# Patient Record
Sex: Female | Born: 1935 | Race: White | Hispanic: No | Smoking: Current every day smoker
Health system: Southern US, Community
[De-identification: ages and names within clinical notes are randomized; demographics above are authoritative.]

## PROBLEM LIST (undated history)

## (undated) DIAGNOSIS — E7800 Pure hypercholesterolemia, unspecified: Secondary | ICD-10-CM

## (undated) DIAGNOSIS — J449 Chronic obstructive pulmonary disease, unspecified: Secondary | ICD-10-CM

## (undated) DIAGNOSIS — I639 Cerebral infarction, unspecified: Secondary | ICD-10-CM

## (undated) DIAGNOSIS — E119 Type 2 diabetes mellitus without complications: Secondary | ICD-10-CM

## (undated) DIAGNOSIS — I10 Essential (primary) hypertension: Secondary | ICD-10-CM

## (undated) DIAGNOSIS — E785 Hyperlipidemia, unspecified: Secondary | ICD-10-CM

## (undated) DIAGNOSIS — J189 Pneumonia, unspecified organism: Secondary | ICD-10-CM

## (undated) DIAGNOSIS — F419 Anxiety disorder, unspecified: Secondary | ICD-10-CM

## (undated) HISTORY — PX: ABDOMINAL HYSTERECTOMY: SHX81

## (undated) HISTORY — PX: EYE SURGERY: SHX253

## (undated) HISTORY — PX: KNEE SURGERY: SHX244

## (undated) HISTORY — DX: Hyperlipidemia, unspecified: E78.5

## (undated) HISTORY — PX: COLONOSCOPY: SHX174

## (undated) HISTORY — PX: TONSILLECTOMY: SUR1361

## (undated) HISTORY — PX: OTHER SURGICAL HISTORY: SHX169

## (undated) HISTORY — PX: LUNG SURGERY: SHX703

---

## 1997-08-05 ENCOUNTER — Inpatient Hospital Stay (HOSPITAL_COMMUNITY): Admission: RE | Admit: 1997-08-05 | Discharge: 1997-08-17 | Payer: Self-pay | Admitting: Thoracic Surgery

## 1997-09-14 ENCOUNTER — Emergency Department (HOSPITAL_COMMUNITY): Admission: EM | Admit: 1997-09-14 | Discharge: 1997-09-14 | Payer: Self-pay | Admitting: Emergency Medicine

## 2000-06-05 ENCOUNTER — Encounter: Payer: Self-pay | Admitting: Family Medicine

## 2000-06-05 ENCOUNTER — Encounter: Admission: RE | Admit: 2000-06-05 | Discharge: 2000-06-05 | Payer: Self-pay | Admitting: Family Medicine

## 2003-02-03 ENCOUNTER — Emergency Department (HOSPITAL_COMMUNITY): Admission: EM | Admit: 2003-02-03 | Discharge: 2003-02-03 | Payer: Self-pay | Admitting: Emergency Medicine

## 2003-06-23 ENCOUNTER — Ambulatory Visit (HOSPITAL_COMMUNITY): Admission: RE | Admit: 2003-06-23 | Discharge: 2003-06-23 | Payer: Self-pay | Admitting: Family Medicine

## 2005-04-22 ENCOUNTER — Emergency Department (HOSPITAL_COMMUNITY): Admission: EM | Admit: 2005-04-22 | Discharge: 2005-04-22 | Payer: Self-pay | Admitting: Emergency Medicine

## 2005-10-01 ENCOUNTER — Encounter: Admission: RE | Admit: 2005-10-01 | Discharge: 2005-10-01 | Payer: Self-pay | Admitting: Orthopedic Surgery

## 2005-12-15 ENCOUNTER — Emergency Department (HOSPITAL_COMMUNITY): Admission: EM | Admit: 2005-12-15 | Discharge: 2005-12-15 | Payer: Self-pay | Admitting: Emergency Medicine

## 2007-12-04 ENCOUNTER — Ambulatory Visit (HOSPITAL_COMMUNITY): Admission: RE | Admit: 2007-12-04 | Discharge: 2007-12-04 | Payer: Self-pay | Admitting: Ophthalmology

## 2008-02-14 ENCOUNTER — Encounter: Payer: Self-pay | Admitting: Emergency Medicine

## 2008-02-15 ENCOUNTER — Inpatient Hospital Stay (HOSPITAL_COMMUNITY): Admission: EM | Admit: 2008-02-15 | Discharge: 2008-02-16 | Payer: Self-pay | Admitting: *Deleted

## 2008-02-16 ENCOUNTER — Ambulatory Visit: Payer: Self-pay | Admitting: *Deleted

## 2009-06-15 ENCOUNTER — Emergency Department (HOSPITAL_COMMUNITY): Admission: EM | Admit: 2009-06-15 | Discharge: 2009-06-15 | Payer: Self-pay | Admitting: Emergency Medicine

## 2010-01-04 ENCOUNTER — Emergency Department (HOSPITAL_COMMUNITY): Admission: EM | Admit: 2010-01-04 | Discharge: 2010-01-04 | Payer: Self-pay | Admitting: Emergency Medicine

## 2010-06-22 LAB — BASIC METABOLIC PANEL
Chloride: 108 mEq/L (ref 96–112)
GFR calc Af Amer: 41 mL/min — ABNORMAL LOW (ref >60)
Potassium: 3.9 mEq/L (ref 3.5–5.1)

## 2010-06-22 LAB — CBC
HCT: 40.6 % (ref 36.0–46.0)
Hemoglobin: 13.6 g/dL (ref 12.0–15.0)
MCH: 31.9 pg (ref 26.0–34.0)
MCHC: 33.5 g/dL (ref 30.0–36.0)
RBC: 4.27 MIL/uL (ref 3.87–5.11)
WBC: 9.9 10*3/uL (ref 4.0–10.5)

## 2010-06-22 LAB — DIFFERENTIAL
Basophils Absolute: 0 10*3/uL (ref 0.0–0.1)
Basophils Relative: 0 % (ref 0–1)
Eosinophils Absolute: 0 10*3/uL (ref 0.0–0.7)
Eosinophils Relative: 0 % (ref 0–5)
Neutro Abs: 7 10*3/uL (ref 1.7–7.7)

## 2010-06-22 LAB — POCT CARDIAC MARKERS
Myoglobin, poc: 60.9 ng/mL (ref 12–200)
Troponin i, poc: <0.05 ng/mL (ref 0.00–0.09)
Troponin i, poc: <0.05 ng/mL (ref 0.00–0.09)

## 2010-08-22 NOTE — Discharge Summary (Signed)
NAME:  Terri Chen, Terri Chen NO.:  [id number]   MEDICAL RECORD NO.:  [id number]          PATIENT TYPE:  IPS   LOCATION:  0301                          FACILITY:  [facility]   PHYSICIAN:  Jasmine Pang, M.D. DATE OF BIRTH:  Dec 10, 1934   DATE OF ADMISSION:  02/15/2008  DATE OF DISCHARGE:  02/16/2008                               DISCHARGE SUMMARY   IDENTIFICATION:  This is a 76 year old widowed white female who was  admitted on a voluntary basis on February 14, 2008.   HISTORY OF PRESENT ILLNESS:  Apparently, she had an argument with her  daughter on the day of admission and took an unknown amount of 0.5 mg  Xanax.  Apparently, it was not that many and she never lost  consciousness.  She stated she wanted to go to sleep and not wake up.  She is grieving the recent death of a nephew who suicided.  This nephew  had brain cancer and it is unclear if he knew what he was doing or not  at that time he suicided by overdose.  She has never had any outpatient  or inpatient psychiatric care.  Her 42 year old daughter and daughter's  two sons were 40 and 68 live with her.  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   PAST PSYCHIATRIC HISTORY:  The patient does not have any family history.  The patient had a nephew with cancer of the brain as indicated above,  who suicided about a week ago.   ALCOHOL AND DRUG HISTORY:  The patient does smoke 1 pack of cigarettes  per day.  She denies use of drugs or alcohol.   MEDICAL PROBLEMS:  Hypertension, diabetes, status post stroke in 08-20-1977,  which precipitated her retirement.   MEDICATION:  She is currently prescribed,  1. Amlodipine 5/10 mg p.o. daily.  2. Metformin 500 mg p.o. b.i.d.  3. Metoprolol tartrate 100 mg p.o. daily.  4. Aspirin 81 mg p.o. daily.   ALLERGIES:  CODEINE.   PHYSICAL FINDINGS:  There were no acute physical or medical problems.  She was medically cleared in the emergency department at  [facility].   HOSPITAL COURSE:  Upon admission, the patient was continued on her home  medication of amlodipine/benazepril 5/10 mg daily, metoprolol 100 mg  daily, aspirin 81 mg daily, and metformin 500 mg p.o. b.i.d.  She stated  she did not want to be on medication.  She was just interested in  therapy.  She felt she could do well without any psychiatric  medications.  In individual sessions with me, the patient was friendly  and cooperative.  She also participated appropriately in unit  therapeutic groups and activities.  She stated she plans to live with  her daughter when she goes home.  She discussed the death of her husband  in 21-Aug-2007.  Her mother also died recently, which has been a stress for  her.  She has no alcohol or drug use.  She discussed the 12 year old  daughter living with her, who causes a  lot of problems because of her  substance use.  She again reiterated she did not want any  antidepressant.  She was willing to talk with a therapist, however.  On  February 16, 2008, mental status had improved markedly from admission  status.  She was still having some difficulty sleeping, but appetite was  good.  Mood was less depressed, less anxious.  Affect was consistent  with mood.  There was no suicidal or homicidal ideation.  No thoughts of  self-injurious behavior.  No auditory or visual hallucinations.  No  paranoia or delusions.  Thoughts were logical and goal-directed, thought  content.  No predominant theme.  Cognitive was grossly intact.  Insight  good, judgment good, impulse control good.  The patient wanted to go  home today and she was felt to be safe for discharge.  She will go to  [facility] in [place] for her followup appointment.  Again, she stated she does not want any mental health medications, but  is open to counseling.   DISCHARGE DIAGNOSES:  Axis I:  Depressive disorder, not otherwise  specified.  Axis II:  None.  Axis III:   Hypertension, diabetes, status post stroke.  Axis IV:  Severe (nephew's death and conflict with her biological  daughter, also mother's death, burden of psychiatric illness, burden of  medical problems).  Axis V:  Global assessment of functioning was 50 upon discharge.  GAF  was 25 upon admission.  GAF highest past year was 65.   DISCHARGE PLAN:  There were no specific activity level or dietary  restrictions except for that necessitated by her diabetes.   POSTHOSPITAL CARE PLANS:  The patient will go to [facility] at [place] on  February 19, 2008, at 8 o'clock a.m.   DISCHARGE MEDICATIONS:  1. Glucophage 500 mg twice daily.  2. Lopressor 100 mg daily.  3. Aspirin 81 mg daily.  4. Lotrel 5/10 mg daily.   She was not started on any mental health medication at her request, but  may be open to this in the future if she begins to feel worse again.      Jasmine Pang, M.D.  Electronically Signed     BHS/MEDQ  D:  02/16/2008  T:  02/17/2008  Job:  [id number]

## 2010-08-22 NOTE — H&P (Signed)
NAME:  Terri Chen, Terri Chen NO.:  [id number]   MEDICAL RECORD NO.:  [id number]          PATIENT TYPE:  IPS   LOCATION:  0301                          FACILITY:  [facility]   PHYSICIAN:  Jasmine Pang, M.D. DATE OF BIRTH:  05/18/1934   DATE OF ADMISSION:  02/15/2008  DATE OF DISCHARGE:                       PSYCHIATRIC ADMISSION ASSESSMENT   This is a voluntary admission to the services of Dr. Milford Cage.  This is a 76 year old widowed white female.  Apparently she had an  argument with her daughter yesterday and took an unknown amount of 0.5  Xanax.  Apparently it was not that many as she never lost consciousness.  She stated she wanted to go to sleep and not wake up.  She is grieving  the recent death of a nephew who suicided.  This nephew had brain cancer  and it is unclear if he really knew what he was doing or not at the time  he suicided.  She has never had any inpatient or outpatient psychiatric  care.  Her 66 year old daughter and her daughter's two sons who are 3  and 48 live with her.  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   PAST PSYCHIATRIC HISTORY:  She does not have any.   SOCIAL HISTORY:  Her husband died in 09/07/1999.  Her daughter and her two  teenage sons lives with the patient.  The daughter is quite unreliable,  apparently abused drugs and goes off and abandons her children from time  to time.   FAMILY HISTORY:  A nephew who had cancer of the brain suicided about a  week ago.   ALCOHOL AND DRUG HISTORY:  The patient does smoke 1 pack of cigarettes  per day.   PRIMARY CARE PHYSICIAN:  Her primary care Demonie Kassa is Dr. Doristine Counter in  [place].   MEDICAL PROBLEMS:  Hypertension, diabetes.  She is status post a stroke  in 09-06-77 which precipitated her retirement.   MEDICATIONS:  1. She is currently prescribed amlodipine 5/10 mg p.o. daily.  2. Metformin 500 mg p.o. b.i.d.  3. Metoprolol tartrate 100 mg p.o. daily.  4.  Aspirin 81 mg p.o. daily.   DRUG ALLERGIES:  CODEINE.   POSITIVE PHYSICAL FINDINGS:  Physically she looks younger than her  stated age of 49 although her face has a lot of wrinkles.  Her motor is  of a younger person as well.  She was medically cleared in the emergency  department at [facility].  There were no remarkable findings.  Her vital  signs on admission show she is 54 inches tall, weighs 146, temperature  is 97.6, blood pressure is 142/82, pulse 59, respirations 18.  She is  status post a stroke in 09/06/77 affecting her left side.  She had cataracts  repaired in 09/06/2001 and August of 2006.   MENTAL STATUS EXAM:  She is alert and oriented.  She was casually  dressed and groomed in pajamas.  She appeared to be adequately  nourished.  Her speech is a normal rate, rhythm and tone.  Her  mood was  appropriate to the situation.  Her affect had a normal range.  Her  thought processes were clear, rational and goal oriented.  Judgment and  insight are intact.  Concentration and memory are intact.  Intelligence  is at least average.  She denies suicidal or homicidal.  She denies  auditory or visual hallucinations.  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  It is  probably actually more adjustment disorder with mixed response of  emotions and conduct.  AXIS II:  None.  AXIS III:  Hypertension, diabetes, status post stroke.  AXIS IV:  Nephew's death and conflict with her biological daughter.  AXIS V:  25.   PLAN:  To identify a therapist for her as she does not want medications.  Toward that end we will have the case manager talk with her tomorrow and  also to evaluate to make sure there is no elder abuse going on.  She  does have a plan that she has two adopted children that live in [place]  and apparently she is welcome at any time and the patient herself has a  discharge plan to go visit with these children.      Mickie Leonarda Salon,  P.A.-C.      Jasmine Pang, M.D.  Electronically Signed    MD/MEDQ  D:  02/15/2008  T:  02/15/2008  Job:  [id number]

## 2011-01-09 LAB — DIFFERENTIAL
Basophils Relative: 1
Eosinophils Relative: 2
Lymphs Abs: 4.7 — ABNORMAL HIGH
Monocytes Relative: 7
Neutro Abs: 3.6
Neutrophils Relative %: 40 — ABNORMAL LOW

## 2011-01-09 LAB — URINALYSIS, ROUTINE W REFLEX MICROSCOPIC
Glucose, UA: NEGATIVE
Ketones, ur: NEGATIVE
Protein, ur: NEGATIVE
Specific Gravity, Urine: 1.02
Urobilinogen, UA: 0.2
pH: 6

## 2011-01-09 LAB — POCT CARDIAC MARKERS
CKMB, poc: <1 — ABNORMAL LOW
Troponin i, poc: <0.05

## 2011-01-09 LAB — RAPID URINE DRUG SCREEN, HOSP PERFORMED
Amphetamines: NOT DETECTED
Cocaine: NOT DETECTED
Opiates: NOT DETECTED
Tetrahydrocannabinol: NOT DETECTED

## 2011-01-09 LAB — SALICYLATE LEVEL: Salicylate Lvl: <4

## 2011-01-09 LAB — CBC
Hemoglobin: 13.5
MCHC: 34.7
Platelets: 225
RDW: 13.2

## 2011-01-09 LAB — COMPREHENSIVE METABOLIC PANEL
ALT: 12
GFR calc Af Amer: >60
GFR calc non Af Amer: >60
Glucose, Bld: 113 — ABNORMAL HIGH
Potassium: 3.4 — ABNORMAL LOW
Sodium: 137
Total Bilirubin: 0.5
Total Protein: 5.7 — ABNORMAL LOW

## 2011-01-09 LAB — GLUCOSE, CAPILLARY
Glucose-Capillary: 98
Glucose-Capillary: 110 — ABNORMAL HIGH
Glucose-Capillary: 113 — ABNORMAL HIGH
Glucose-Capillary: 143 — ABNORMAL HIGH

## 2011-01-09 LAB — ETHANOL: Alcohol, Ethyl (B): <5

## 2011-01-09 LAB — APTT: aPTT: 24

## 2011-01-09 LAB — PROTIME-INR: INR: 0.9

## 2011-01-09 LAB — ACETAMINOPHEN LEVEL: Acetaminophen (Tylenol), Serum: <10 — ABNORMAL LOW

## 2011-08-26 ENCOUNTER — Encounter (HOSPITAL_BASED_OUTPATIENT_CLINIC_OR_DEPARTMENT_OTHER): Payer: Self-pay | Admitting: *Deleted

## 2011-08-26 ENCOUNTER — Emergency Department (HOSPITAL_BASED_OUTPATIENT_CLINIC_OR_DEPARTMENT_OTHER)
Admission: EM | Admit: 2011-08-26 | Discharge: 2011-08-26 | Disposition: A | Discharge status: Home / Self Care | Payer: Medicare Other | Attending: Emergency Medicine | Admitting: Emergency Medicine

## 2011-08-26 DIAGNOSIS — Z8673 Personal history of transient ischemic attack (TIA), and cerebral infarction without residual deficits: Secondary | ICD-10-CM | POA: Insufficient documentation

## 2011-08-26 DIAGNOSIS — I10 Essential (primary) hypertension: Secondary | ICD-10-CM | POA: Insufficient documentation

## 2011-08-26 DIAGNOSIS — Z79899 Other long term (current) drug therapy: Secondary | ICD-10-CM | POA: Insufficient documentation

## 2011-08-26 DIAGNOSIS — E119 Type 2 diabetes mellitus without complications: Secondary | ICD-10-CM | POA: Insufficient documentation

## 2011-08-26 DIAGNOSIS — IMO0002 Reserved for concepts with insufficient information to code with codable children: Secondary | ICD-10-CM

## 2011-08-26 DIAGNOSIS — L089 Local infection of the skin and subcutaneous tissue, unspecified: Secondary | ICD-10-CM | POA: Insufficient documentation

## 2011-08-26 DIAGNOSIS — Y92009 Unspecified place in unspecified non-institutional (private) residence as the place of occurrence of the external cause: Secondary | ICD-10-CM | POA: Insufficient documentation

## 2011-08-26 HISTORY — DX: Cerebral infarction, unspecified: I63.9

## 2011-08-26 HISTORY — DX: Essential (primary) hypertension: I10

## 2011-08-26 MED ORDER — CLINDAMYCIN HCL 150 MG PO CAPS: 300.0000 mg | ORAL_CAPSULE | Freq: Four times a day (QID) | ORAL | Status: AC

## 2011-08-26 MED ORDER — TRAMADOL HCL 50 MG PO TABS: 50.0000 mg | ORAL_TABLET | Freq: Four times a day (QID) | ORAL | Status: AC | PRN

## 2011-08-26 NOTE — ED Notes (Signed)
rx x 2 for clindamycin and tramadol given

## 2011-08-26 NOTE — ED Notes (Signed)
Pt states she ?was bitten by a spider in 2 areas of her head Friday. Presents with 2 slightly reddened areas to top of head and neck. States she has been bitten before and this feels the same.

## 2011-08-26 NOTE — ED Provider Notes (Signed)
History  This chart was scribed for Loren Racer, MD by Cherlynn Perches. The patient was seen in room MHT13/MHT13. Patient's care was started at 1443.   CSN: [id number]  Arrival date & time 08/26/11  1443   First MD Initiated Contact with Patient 08/26/11 1536      Chief Complaint  Patient presents with  . Insect Bite    (Consider location/radiation/quality/duration/timing/severity/associated sxs/prior treatment) HPI  Terri Chen is a 77 y.o. female who presents to the Emergency Department complaining of 2 days of sudden onset, unchanged insect bites localized to the top-left side and bottom-left side of head with associated nausea. Pt reports that she believes she was bitten by a spider in her house. Pt states that she has been bitten by a spider before and had to be admitted to the hospital for an infection. She reports that symptoms are similar to this previous incident. Pt denies abdominal pain, vomiting, and fever.  Past Medical History  Diagnosis Date  . Diabetes mellitus   . Hypertension   . Stroke     Past Surgical History  Procedure Date  . Lung surgery   . Knee surgery   . Abdominal hysterectomy     History reviewed. No pertinent family history.  History  Substance Use Topics  . Smoking status: Current Some Day Smoker  . Smokeless tobacco: Not on file  . Alcohol Use: No    OB History    Grav Para Term Preterm Abortions TAB SAB Ect Mult Living                  Review of Systems  Constitutional: Positive for chills. Negative for fever.  HENT: Negative for congestion and neck pain.   Respiratory: Negative for cough and shortness of breath.   Cardiovascular: Negative for chest pain.  Gastrointestinal: Positive for nausea. Negative for vomiting and abdominal pain.  Musculoskeletal: Negative for back pain.  Skin: Positive for color change (redness to head).       Insect bite  All other systems reviewed and are negative.    Allergies  Review of  patient's allergies indicates no known allergies.  Home Medications   Current Outpatient Rx  Name Route Sig Dispense Refill  . AMLODIPINE BESYLATE 5 MG PO TABS Oral Take 5 mg by mouth daily.    Marland Kitchen BENAZEPRIL HCL 20 MG PO TABS Oral Take 20 mg by mouth daily.    Marland Kitchen HYDROCHLOROTHIAZIDE 25 MG PO TABS Oral Take 25 mg by mouth daily.    Marland Kitchen METFORMIN HCL 1000 MG PO TABS Oral Take 1,000 mg by mouth 2 (two) times daily.    Marland Kitchen PRAVASTATIN SODIUM 40 MG PO TABS Oral Take 40 mg by mouth daily.    Marland Kitchen CLINDAMYCIN HCL 150 MG PO CAPS Oral Take 2 capsules (300 mg total) by mouth every 6 (six) hours. 40 capsule 0  . TRAMADOL HCL 50 MG PO TABS Oral Take 1 tablet (50 mg total) by mouth every 6 (six) hours as needed for pain. 15 tablet 0    BP 127/45  Pulse 65  Temp(Src) 97.9 F (36.6 C) (Oral)  Resp 20  Ht 5\' 4"  (1.626 m)  Wt 155 lb (70.308 kg)  BMI 26.61 kg/m2  SpO2 99%  Physical Exam  Nursing note and vitals reviewed. Constitutional: She is oriented to person, place, and time. She appears well-developed and well-nourished.  HENT:  Head: Normocephalic and atraumatic.       1 area of erythema on left  occipital, 1 area on left parietal, mild cellulitis, no fluctuance, evidence of abscess.  Eyes: Conjunctivae are normal. No scleral icterus.  Neck: Normal range of motion. Neck supple.  Cardiovascular: Normal rate and regular rhythm.  Exam reveals no gallop and no friction rub.   No murmur heard. Pulmonary/Chest: Effort normal and breath sounds normal. No respiratory distress.  Abdominal: Soft. There is no tenderness.  Musculoskeletal: Normal range of motion. She exhibits no edema.  Neurological: She is alert and oriented to person, place, and time.       Grip strength normal  Skin: Skin is warm and dry.  Psychiatric: She has a normal mood and affect. Her behavior is normal.    ED Course  Procedures (including critical care time)  DIAGNOSTIC STUDIES: Oxygen Saturation is 99% on room air, normal by  my interpretation.    COORDINATION OF CARE: 3:45PM - will prescribe antibiotics to prevent infection. Pt agrees with plan.    Labs Reviewed - No data to display No results found.   1. Bite by nonvenomous insect of face/neck/scalp, with infection       MDM        I personally performed the services described in this documentation, which was scribed in my presence. The recorded information has been reviewed and considered.     Loren Racer, MD 08/26/11 2039

## 2011-08-26 NOTE — Discharge Instructions (Signed)
Insect Bite Mosquitoes, flies, fleas, bedbugs, and many other insects can bite. Insect bites are different from insect stings. A sting is when venom is injected into the skin. Some insect bites can transmit infectious diseases. SYMPTOMS  Insect bites usually turn red, swell, and itch for 2 to 4 days. They often go away on their own. TREATMENT  Your caregiver may prescribe antibiotic medicines if a bacterial infection develops in the bite. HOME CARE INSTRUCTIONS  Do not scratch the bite area.   Keep the bite area clean and dry. Wash the bite area thoroughly with soap and water.   Put ice or cool compresses on the bite area.   Put ice in a plastic bag.   Place a towel between your skin and the bag.   Leave the ice on for 20 minutes, 4 times a day for the first 2 to 3 days, or as directed.   You may apply a baking soda paste, cortisone cream, or calamine lotion to the bite area as directed by your caregiver. This can help reduce itching and swelling.   Only take over-the-counter or prescription medicines as directed by your caregiver.   If you are given antibiotics, take them as directed. Finish them even if you start to feel better.  You may need a tetanus shot if:  You cannot remember when you had your last tetanus shot.   You have never had a tetanus shot.   The injury broke your skin.  If you get a tetanus shot, your arm may swell, get red, and feel warm to the touch. This is common and not a problem. If you need a tetanus shot and you choose not to have one, there is a rare chance of getting tetanus. Sickness from tetanus can be serious. SEEK IMMEDIATE MEDICAL CARE IF:   You have increased pain, redness, or swelling in the bite area.   You see a red line on the skin coming from the bite.   You have a fever.   You have joint pain.   You have a headache or neck pain.   You have unusual weakness.   You have a rash.   You have chest pain or shortness of breath.   You  have abdominal pain, nausea, or vomiting.   You feel unusually tired or sleepy.  MAKE SURE YOU:   Understand these instructions.   Will watch your condition.   Will get help right away if you are not doing well or get worse.  Document Released: 05/03/2004 Document Revised: 03/15/2011 Document Reviewed: 10/25/2010 [employer] Patient Information 2012 [employer]. 

## 2013-09-04 ENCOUNTER — Encounter (HOSPITAL_COMMUNITY): Payer: Self-pay | Admitting: Emergency Medicine

## 2013-09-04 ENCOUNTER — Emergency Department (HOSPITAL_COMMUNITY): Payer: Medicare Other

## 2013-09-04 ENCOUNTER — Emergency Department (HOSPITAL_COMMUNITY)
Admission: EM | Admit: 2013-09-04 | Discharge: 2013-09-04 | Disposition: A | Discharge status: Home / Self Care | Payer: Medicare Other | Attending: Emergency Medicine | Admitting: Emergency Medicine

## 2013-09-04 DIAGNOSIS — Z79899 Other long term (current) drug therapy: Secondary | ICD-10-CM | POA: Insufficient documentation

## 2013-09-04 DIAGNOSIS — Z7982 Long term (current) use of aspirin: Secondary | ICD-10-CM | POA: Insufficient documentation

## 2013-09-04 DIAGNOSIS — S52501A Unspecified fracture of the lower end of right radius, initial encounter for closed fracture: Secondary | ICD-10-CM

## 2013-09-04 DIAGNOSIS — S2242XA Multiple fractures of ribs, left side, initial encounter for closed fracture: Secondary | ICD-10-CM

## 2013-09-04 DIAGNOSIS — W19XXXA Unspecified fall, initial encounter: Secondary | ICD-10-CM

## 2013-09-04 DIAGNOSIS — Y9389 Activity, other specified: Secondary | ICD-10-CM | POA: Insufficient documentation

## 2013-09-04 DIAGNOSIS — F17200 Nicotine dependence, unspecified, uncomplicated: Secondary | ICD-10-CM | POA: Insufficient documentation

## 2013-09-04 DIAGNOSIS — S52599A Other fractures of lower end of unspecified radius, initial encounter for closed fracture: Secondary | ICD-10-CM | POA: Insufficient documentation

## 2013-09-04 DIAGNOSIS — E119 Type 2 diabetes mellitus without complications: Secondary | ICD-10-CM | POA: Insufficient documentation

## 2013-09-04 DIAGNOSIS — I10 Essential (primary) hypertension: Secondary | ICD-10-CM | POA: Insufficient documentation

## 2013-09-04 DIAGNOSIS — Y92009 Unspecified place in unspecified non-institutional (private) residence as the place of occurrence of the external cause: Secondary | ICD-10-CM | POA: Insufficient documentation

## 2013-09-04 DIAGNOSIS — R51 Headache: Secondary | ICD-10-CM | POA: Insufficient documentation

## 2013-09-04 DIAGNOSIS — W010XXA Fall on same level from slipping, tripping and stumbling without subsequent striking against object, initial encounter: Secondary | ICD-10-CM | POA: Insufficient documentation

## 2013-09-04 DIAGNOSIS — S2249XA Multiple fractures of ribs, unspecified side, initial encounter for closed fracture: Secondary | ICD-10-CM | POA: Insufficient documentation

## 2013-09-04 MED ORDER — ONDANSETRON 4 MG PO TBDP
4.0000 mg | ORAL_TABLET | Freq: Once | ORAL | Status: AC
  Administered 2013-09-04: 4 mg via ORAL
  Filled 2013-09-04: qty 1

## 2013-09-04 MED ORDER — MORPHINE SULFATE 4 MG/ML IJ SOLN
4.0000 mg | Freq: Once | INTRAMUSCULAR | Status: AC
  Administered 2013-09-04: 4 mg via INTRAMUSCULAR
  Filled 2013-09-04: qty 1

## 2013-09-04 MED ORDER — OXYCODONE-ACETAMINOPHEN 5-325 MG PO TABS: 1.0000 | ORAL_TABLET | ORAL | Status: DC | PRN

## 2013-09-04 MED ORDER — OXYCODONE-ACETAMINOPHEN 5-325 MG PO TABS
1.0000 | ORAL_TABLET | Freq: Once | ORAL | Status: AC
  Administered 2013-09-04: 1 via ORAL
  Filled 2013-09-04: qty 1

## 2013-09-04 NOTE — ED Notes (Signed)
Pt arrived to ED in c-collar and LSB.  Removed from LSB via log roll with 3 person assist.  Denies back pain with palpation.

## 2013-09-04 NOTE — ED Provider Notes (Signed)
CSN: [id number]     Arrival date & time 09/04/13  1206 History  This chart was scribed for Terri GivensIva L Darwin Guastella, MD by Shari HeritageAisha Amuda, ED Scribe. The patient was seen in room APAH4/APAH4. Patient's care was started at 12:41 PM.    Chief Complaint  Patient presents with  . Fall    The history is provided by the patient. No language interpreter was used.    HPI Comments: Terri Chen is a 79 y.o. female who presents to the Emergency Department via [facility] complaining of a fall that occurred immediately prior to arrival. Patient states that she was taking the trash out when she tripped and fell face first onto her outstretched right arm and then fell onto her left chest onto the ground. She says that she did not get dizziness or lightheadedness prior to the fall. She denies loss of consciousness after the fall. She is currently complaining of right wrist and left rib pain. Patient is right-handed. Patient further denies shortness of breath, neck pain, face pain, back or other symptoms at this time. She was placed in a C-collar by EMS. She has a medical history of DM and hypertension.  PCP Dr Rosezetta SchlatterBurnette   Past Medical History  Diagnosis Date  . Diabetes mellitus   . Hypertension   . 1979    Past Surgical History  Procedure Laterality Date  . Lung surgery    . Knee surgery    . Abdominal hysterectomy     History reviewed. No pertinent family history. History  Substance Use Topics  . Smoking status: Current Some Day Smoker  . Smokeless tobacco: Not on file  . Alcohol Use: No  Patient smokes 0.5 packs of cigarettes per day. She lives at home with her daughter, two grandsons and dog.  OB History   Grav Para Term Preterm Abortions TAB SAB Ect Mult Living                 Review of Systems  Cardiovascular: Negative for chest pain.  Gastrointestinal: Negative for abdominal pain.  Musculoskeletal: Positive for arthralgias (left rib pain, right wrist pain). Negative for back pain and neck  pain.  Neurological: Negative for syncope and headaches.  All other systems reviewed and are negative.     Allergies  Review of patient's allergies indicates no known allergies.  Home Medications   Prior to Admission medications   Medication Sig Start Date End Date Taking? Authorizing Provider  amLODipine (NORVASC) 5 MG tablet Take 5 mg by mouth daily.   Yes Historical Provider, MD  aspirin EC 81 MG tablet Take 81 mg by mouth daily.   Yes Historical Provider, MD  benazepril (LOTENSIN) 20 MG tablet Take 20 mg by mouth daily.   Yes Historical Provider, MD  hydrochlorothiazide (HYDRODIURIL) 25 MG tablet Take 25 mg by mouth daily.   Yes Historical Provider, MD  metFORMIN (GLUCOPHAGE) 1000 MG tablet Take 1,000 mg by mouth 2 (two) times daily.   Yes Historical Provider, MD  metoprolol (LOPRESSOR) 100 MG tablet Take 100 mg by mouth daily. 07/23/13  Yes Historical Provider, MD  mometasone-formoterol (DULERA) 100-5 MCG/ACT AERO Inhale 2 puffs into the lungs 2 (two) times daily.   Yes Historical Provider, MD  pravastatin (PRAVACHOL) 40 MG tablet Take 40 mg by mouth at bedtime.    Yes Historical Provider, MD  oxyCODONE-acetaminophen (PERCOCET/ROXICET) 5-325 MG per tablet Take 1-2 tablets by mouth every 4 (four) hours as needed for severe pain. 09/04/13   Terri Chen  Hildred Laser, MD   Triage Vitals: BP 104/49  Pulse 61  Temp(Src) 97.8 F (36.6 C) (Oral)  Resp 16  Ht 5\' 5"  (1.651 m)  Wt 155 lb (70.308 kg)  BMI 25.79 kg/m2  SpO2 98%  Vital signs normal   Physical Exam  Nursing note and vitals reviewed. Constitutional: She is oriented to person, place, and time. She appears well-developed and well-nourished.  Non-toxic appearance. She does not appear ill. No distress.  HENT:  Head: Normocephalic and atraumatic.  Right Ear: External ear normal.  Left Ear: External ear normal.  Nose: Nose normal. No mucosal edema or rhinorrhea.  Mouth/Throat: Oropharynx is clear and moist and mucous membranes are  normal. No dental abscesses or uvula swelling.  Eyes: Conjunctivae and EOM are normal. Pupils are equal, round, and reactive to light.  Neck: Normal range of motion and full passive range of motion without pain. Neck supple.  Cardiovascular: Normal rate, regular rhythm and normal heart sounds.  Exam reveals no gallop and no friction rub.   No murmur heard. Pulmonary/Chest: Effort normal and breath sounds normal. No respiratory distress. She has no wheezes. She has no rhonchi. She has no rales. She exhibits no tenderness and no crepitus.    No bruising to chest wall. Tender over left anterior inferior rib cage near the costal margin. No abrasions or lacerations seen  Abdominal: Soft. Normal appearance and bowel sounds are normal. She exhibits no distension. There is no tenderness. There is no rebound and no guarding.  Musculoskeletal: Normal range of motion. She exhibits no edema.       Right wrist: She exhibits deformity.  Deformity of right wrist. Normal ROM of fingers. Intact distal pulses and intact sensation.    Neurological: She is alert and oriented to person, place, and time. She has normal strength. No cranial nerve deficit.  Skin: Skin is warm, dry and intact. No rash noted. No erythema. No pallor.  Psychiatric: She has a normal mood and affect. Her speech is normal and behavior is normal. Her mood appears not anxious.    ED Course  Procedures (including critical care time) Medications  oxyCODONE-acetaminophen (PERCOCET/ROXICET) 5-325 MG per tablet 1 tablet (not administered)  morphine 4 MG/ML injection 4 mg (4 mg Intramuscular Given 09/04/13 1304)  ondansetron (ZOFRAN-ODT) disintegrating tablet 4 mg (4 mg Oral Given 09/04/13 1304)    DIAGNOSTIC STUDIES: Oxygen Saturation is 98% on room air, normal by my interpretation.    COORDINATION OF CARE:. 12:46 PM- Will order CTs of head, cervical spine, and x-rays of left ribs and right wrist. Patient informed of current plan for  treatment and evaluation and agrees with plan at this time.   1435 patient discussed with Dr. Romeo Apple when I called him about another patient. He wants her placed in a splint and he will see  in his office on Monday, June 1 at 8:30 AM.  Patient placed in sugar tong splint. She was placed in a sling. She was given an incentive spirometer to take home.  Labs Review Labs Reviewed - No data to display  Imaging Review Dg Ribs Unilateral W/chest Left  09/04/2013   CLINICAL DATA:  Pain post trauma  EXAM: LEFT RIBS AND CHEST - 3+ VIEW  COMPARISON:  Chest radiograph June 14, 2009  FINDINGS: Frontal chest as well as bilateral oblique and cone-down lower rib images were obtained. Lungs are clear. Heart size and pulmonary vascularity are normal.  There are fractures of the anterior left seventh, eighth, and  ninth ribs. The seventh and eighth rib fractures are slightly displaced. There is soft tissue air in the left hemithorax. A pneumothorax is not seen, however. There are no effusions.  IMPRESSION: Fractures of the anterior left seventh, eighth, and ninth ribs. No pneumothorax appreciable. Lungs are clear. Note that there is soft tissue air on the left. The localized distribution of this air raises question of air in a laceration as opposed to subcutaneous emphysema. Clinical evaluation of this area is advised. Close clinical assessment remains warranted given mildly displaced rib fractures on the left.   Electronically Signed   By: Bretta Bang M.D.   On: 09/04/2013 13:45   Dg Wrist Complete Right  09/04/2013   CLINICAL DATA:  Fall.  EXAM: RIGHT WRIST - COMPLETE 3+ VIEW  COMPARISON:  09/10/2005.  FINDINGS: Comminuted distal radial fracture is present with mild angulation deformity. Extension of fracture into the radiocarpal joint space noted. Prominent degenerative changes present. Ulnar styloid is intact. Diffuse osteopenia .  IMPRESSION: Comminuted distal radial fracture with extension into the  radiocarpal joint space.   Electronically Signed   By: Maisie Fus  Register   On: 09/04/2013 13:49   Ct Head Wo Contrast  Ct Cervical Spine Wo Contrast  09/04/2013   CLINICAL DATA:  Pain post trauma  EXAM: CT HEAD WITHOUT CONTRAST  CT CERVICAL SPINE WITHOUT CONTRAST  TECHNIQUE: Multidetector CT imaging of the head and cervical spine was performed following the standard protocol without intravenous contrast. Multiplanar CT image reconstructions of the cervical spine were also generated.  COMPARISON:  None.  FINDINGS: CT HEAD FINDINGS  There is mild diffuse atrophy. There is no mass, hemorrhage, extra-axial fluid collection, or midline shift. There is patchy small vessel disease in the centra semiovale bilaterally. Elsewhere gray-white compartments appear normal. There is no demonstrable acute infarct. Bony calvarium appears intact. The mastoid air cells are clear. There is mucosal thickening in multiple ethmoid air cells bilaterally.  CT CERVICAL SPINE FINDINGS  There is no fracture or spondylolisthesis. Prevertebral soft tissues and predental space regions are normal. Disk spaces appear intact. There is facet osteoarthritic change at multiple levels bilaterally. No disc extrusion or stenosis. Bones appear somewhat osteoporotic. There is extensive carotid and vertebral artery calcification bilaterally.  IMPRESSION: CT head: Mild atrophy with patchy periventricular small vessel disease. There is no intracranial mass, hemorrhage, or apparent extra-axial fluid. There is mild ethmoid sinus disease bilaterally.  CT cervical spine: Osteoarthritic changes several levels. Bones are osteoporotic. No apparent fracture or dislocation. Extensive arterial vascular calcification bilaterally.   Electronically Signed   By: Bretta Bang M.D.   On: 09/04/2013 14:22     EKG Interpretation None      MDM  patient with mechanical fall with fracture of her distal radius that is comminuted and slightly angulated. She's been  treated with a sugar tong splint after discussion with orthopedics until she can get definitive treatment by orthopedics. She also has at least 3 rib fractures. Patient has no shortness of breath at this time. She was given incentive spirometer to use at home to prevent pneumonia. We discussed she needs to return to the ER by EMS if she has any shortness of breath or worsening of her breathing.    Final diagnoses:  Fall at home  Distal radius fracture, right  Multiple fractures of ribs of left side   New Prescriptions   OXYCODONE-ACETAMINOPHEN (PERCOCET/ROXICET) 5-325 MG PER TABLET    Take 1-2 tablets by mouth every 4 (four) hours as  needed for severe pain.   Plan discharge  Devoria Albe, MD, FACEP   I personally performed the services described in this documentation, which was scribed in my presence. The recorded information has been reviewed and considered.  Devoria Albe, MD, Armando Gang     Terri Givens, MD 09/04/13 [phone]

## 2013-09-04 NOTE — Discharge Instructions (Signed)
Take the percocet for pain. Elevate your wrist. Use ice packs for pain and to keep the swelling down. Use the incentive spirometry every 2 hours while awake. Return to the ED if you get short of breath, struggle to breath or get worsening chest pain. Also is you get numbness or pain in your fingers, or your fingers turn white. The rib fractures will take about 6 weeks to heal, but should improve in the next 10-12 days. You will need to get more pain medication from Dr Rosezetta Schlatter or Dr Romeo Apple. You have an appointment on Monday, June 1st at 8:30 with Dr Romeo Apple in his office.

## 2013-09-04 NOTE — ED Notes (Signed)
Ice applied to right wrist.

## 2013-09-04 NOTE — ED Notes (Signed)
Pt's wallet was given to this RN by Azalee Course from radiology and returned to pt.

## 2013-09-04 NOTE — ED Notes (Signed)
Pt states that she tripped and fell prior to arrival. C/o right-sided rib pain and right knee pain. No deformity noted. Pt denies SOB, CP, LOC. Pt on LSB with C-collar placed by EMS.

## 2013-09-07 ENCOUNTER — Encounter: Payer: Self-pay | Admitting: Orthopedic Surgery

## 2013-09-07 ENCOUNTER — Ambulatory Visit (INDEPENDENT_AMBULATORY_CARE_PROVIDER_SITE_OTHER): Payer: Medicare Other | Admitting: Orthopedic Surgery

## 2013-09-07 VITALS — BP 112/80 | Ht 64.0 in

## 2013-09-07 DIAGNOSIS — S52539A Colles' fracture of unspecified radius, initial encounter for closed fracture: Secondary | ICD-10-CM

## 2013-09-07 DIAGNOSIS — S52531A Colles' fracture of right radius, initial encounter for closed fracture: Secondary | ICD-10-CM | POA: Insufficient documentation

## 2013-09-07 MED ORDER — OXYCODONE-ACETAMINOPHEN 5-325 MG PO TABS: 1.0000 | ORAL_TABLET | ORAL | Status: DC | PRN

## 2013-09-07 NOTE — Patient Instructions (Signed)
Keep  Cast dry   Do not get wet   If it gets wet dry with a hair dryer on low setting and call the office   

## 2013-09-07 NOTE — Progress Notes (Signed)
Patient ID: Terri Chen, female   DOB: 1934/09/12, 79 y.o.   MRN: [id number]  Chief Complaint  Patient presents with  . Wrist Injury    Right wrist fracture, DOI 09-04-13. Xrays at [facility]    HISTORY: 79 years old fell on Friday, May 29 injured her right wrist and her ribs on the left side. She has a previous injury to her left leg about a week prior to this injury. She now complains of pain swelling over the right wrist with sharp throbbing sensation which is 7/10 relieved by Percocet  Review of Systems  Respiratory: Positive for shortness of breath.   Cardiovascular: Positive for chest pain.  Neurological: Positive for dizziness.  Psychiatric/Behavioral: Positive for memory loss.  All other systems reviewed and are negative.  Past Medical History  Diagnosis Date  . Diabetes mellitus   . Hypertension   . 1979    Past Surgical History  Procedure Laterality Date  . Lung surgery    . Knee surgery    . Abdominal hysterectomy     Vital signs: BP 112/80  Ht 5\' 4"  (1.626 m)   General the patient is well-developed and well-nourished grooming and hygiene are normal Oriented x3 Mood and affect normal Ambulation she has difficulty ambulating and needed standby assist Inspection of the right wrist shows tenderness and swelling mild deformity in the coronal plane she has lost some of the motion of the wrist secondary to pain there is no instability of the joint confirmed by x-ray. Muscle tone is normal skin bruised but clean dry and intact good distal pulse and normal sensation Cardiovascular exam is normal Sensory exam normal  X-rays show an impacted distal radius fracture with some radial deviation  Recommend short arm cast  Short-term cast applied  In 6 weeks for x-ray

## 2013-10-27 ENCOUNTER — Encounter: Payer: Self-pay | Admitting: Orthopedic Surgery

## 2013-10-27 ENCOUNTER — Ambulatory Visit (INDEPENDENT_AMBULATORY_CARE_PROVIDER_SITE_OTHER): Payer: Self-pay | Admitting: Orthopedic Surgery

## 2013-10-27 ENCOUNTER — Ambulatory Visit (INDEPENDENT_AMBULATORY_CARE_PROVIDER_SITE_OTHER): Payer: Medicare Other

## 2013-10-27 VITALS — BP 97/58 | Ht 64.0 in | Wt 155.0 lb

## 2013-10-27 DIAGNOSIS — S52531A Colles' fracture of right radius, initial encounter for closed fracture: Secondary | ICD-10-CM

## 2013-10-27 DIAGNOSIS — S62101D Fracture of unspecified carpal bone, right wrist, subsequent encounter for fracture with routine healing: Secondary | ICD-10-CM

## 2013-10-27 DIAGNOSIS — IMO0001 Reserved for inherently not codable concepts without codable children: Secondary | ICD-10-CM

## 2013-10-27 DIAGNOSIS — S52539A Colles' fracture of unspecified radius, initial encounter for closed fracture: Secondary | ICD-10-CM

## 2013-10-27 MED ORDER — HYDROCODONE-ACETAMINOPHEN 5-325 MG PO TABS: 1.0000 | ORAL_TABLET | Freq: Four times a day (QID) | ORAL | Status: DC | PRN

## 2013-10-27 NOTE — Progress Notes (Signed)
Patient ID: Terri Chen, female   DOB: [birth year], 79 y.o.   MRN: [id number]  Chief Complaint  Patient presents with  . Follow-up    recheck and xray right wrist fracture, DOI 09/04/13   Encounter Diagnoses  Name Primary?  . Wrist fracture, right, with routine healing, subsequent encounter Yes  . Colles' fracture of right radius, closed, initial encounter    BP 97/58  Ht 5\' 4"  (1.626 m)  Wt 155 lb (70.308 kg)  BMI 26.59 kg/m2  Cast off x-rays right wrist 8 weeks post injury after a short arm cast. Mild pain mild tenderness. X-rays: Show fracture healing with some loss of radial inclination  Recommend splint for one month in followup for reevaluation. The fracture appears to have healed.

## 2013-10-27 NOTE — Patient Instructions (Signed)
Brace x 1 month  F/u 1 month

## 2013-12-01 ENCOUNTER — Ambulatory Visit: Payer: Medicare Other | Admitting: Orthopedic Surgery

## 2013-12-10 ENCOUNTER — Ambulatory Visit (INDEPENDENT_AMBULATORY_CARE_PROVIDER_SITE_OTHER): Payer: Medicare Other | Admitting: Orthopedic Surgery

## 2013-12-10 VITALS — BP 130/80 | Ht 64.0 in | Wt 155.0 lb

## 2013-12-10 DIAGNOSIS — S52531A Colles' fracture of right radius, initial encounter for closed fracture: Secondary | ICD-10-CM

## 2013-12-10 DIAGNOSIS — M25531 Pain in right wrist: Secondary | ICD-10-CM

## 2013-12-10 DIAGNOSIS — S52539A Colles' fracture of unspecified radius, initial encounter for closed fracture: Secondary | ICD-10-CM

## 2013-12-10 DIAGNOSIS — M25539 Pain in unspecified wrist: Secondary | ICD-10-CM

## 2013-12-10 MED ORDER — HYDROCODONE-ACETAMINOPHEN 5-325 MG PO TABS: 1.0000 | ORAL_TABLET | Freq: Four times a day (QID) | ORAL | Status: DC | PRN

## 2013-12-10 NOTE — Progress Notes (Signed)
Established patient followup fracture outside the 90 day. Chief Complaint  Patient presents with  . Follow-up    1 month recheck right wrist in brace, fx care, DOI 09/04/13    Impacted right distal radius fracture treated with cast and then brace he complains of ulnar sided pain secondary to ulnar impaction  Review of systems she says is hard for her to lift things she is having some joint pain on the ulnar side of the wrist and on the volar side of the wrist.  Exam.vs BP 130/80  Ht  (1.626 m)  Wt 155 lb (70.308 kg)  BMI 26.59 kg/m2 General appearance is normal, the patient is alert and oriented x3 with normal mood and affect. She has some tenderness and ulnar prominence pain mild with flexion no extension pain mild weakness of her grip strength she has a subluxated CMC joint which she says bothers her as well neurovascular exam intact  X-rays show some ulnar abutment  I think at her age and her osteoporosis that she will have to live with her problem as it is.  Meds ordered this encounter  Medications  . DISCONTD: HYDROcodone-acetaminophen (NORCO/VICODIN) 5-325 MG per tablet    Sig: Take 1 tablet by mouth every 6 (six) hours as needed for moderate pain.    Dispense:  90 tablet    Refill:  0  . HYDROcodone-acetaminophen (NORCO/VICODIN) 5-325 MG per tablet    Sig: Take 1 tablet by mouth every 6 (six) hours as needed for moderate pain.    Dispense:  90 tablet    Refill:  0

## 2013-12-10 NOTE — Patient Instructions (Addendum)
Meds ordered this encounter  Medications  . HYDROcodone-acetaminophen (NORCO/VICODIN) 5-325 MG per tablet    Sig: Take 1 tablet by mouth every 6 (six) hours as needed for moderate pain.    Dispense:  90 tablet    Refill:  0  '

## 2014-01-25 ENCOUNTER — Telehealth: Payer: Self-pay | Admitting: Orthopedic Surgery

## 2014-01-25 ENCOUNTER — Other Ambulatory Visit: Payer: Self-pay | Admitting: *Deleted

## 2014-01-25 DIAGNOSIS — M25531 Pain in right wrist: Secondary | ICD-10-CM

## 2014-01-25 MED ORDER — HYDROCODONE-ACETAMINOPHEN 5-325 MG PO TABS: 1.0000 | ORAL_TABLET | Freq: Four times a day (QID) | ORAL | Status: DC | PRN

## 2014-01-25 NOTE — Telephone Encounter (Signed)
Patient is c/o right wrist and hand swelling with pain, shes asking for a refill on herHYDROcodone-acetaminophen (NORCO/VICODIN) 5-325 MG per please advise?

## 2014-01-25 NOTE — Telephone Encounter (Signed)
Prescription available, patient aware  

## 2014-02-23 ENCOUNTER — Telehealth: Payer: Self-pay | Admitting: Orthopedic Surgery

## 2014-02-23 ENCOUNTER — Other Ambulatory Visit: Payer: Self-pay | Admitting: *Deleted

## 2014-02-23 DIAGNOSIS — M25531 Pain in right wrist: Secondary | ICD-10-CM

## 2014-02-23 MED ORDER — HYDROCODONE-ACETAMINOPHEN 5-325 MG PO TABS: 1.0000 | ORAL_TABLET | Freq: Four times a day (QID) | ORAL | Status: DC | PRN

## 2014-02-23 NOTE — Telephone Encounter (Signed)
Prescription available, patient aware  

## 2014-02-23 NOTE — Telephone Encounter (Signed)
Patient is calling asking for a refill on her pain med HYDROcodone-acetaminophen (NORCO/VICODIN) 5-325 MG per tablet for her right arm, please advise?

## 2014-02-24 NOTE — Telephone Encounter (Signed)
Patient picked up Rx

## 2014-03-22 ENCOUNTER — Other Ambulatory Visit: Payer: Self-pay | Admitting: *Deleted

## 2014-03-22 ENCOUNTER — Telehealth: Payer: Self-pay | Admitting: Orthopedic Surgery

## 2014-03-22 DIAGNOSIS — M25531 Pain in right wrist: Secondary | ICD-10-CM

## 2014-03-22 MED ORDER — HYDROCODONE-ACETAMINOPHEN 5-325 MG PO TABS: 1.0000 | ORAL_TABLET | Freq: Four times a day (QID) | ORAL | Status: DC | PRN

## 2014-03-22 NOTE — Telephone Encounter (Signed)
Call received from patient for refill of pain medication, HYDROcodone-acetaminophen (NORCO/VICODIN) 5-325 MG per tablet [[id number]][[id number]]  She was last seen 12/10/13 for her fracture care of wrist; no other appointment scheduled.  Her ph# is [phone].

## 2014-03-22 NOTE — Telephone Encounter (Signed)
Prescription available, patient aware  

## 2014-03-23 NOTE — Telephone Encounter (Signed)
Patient picked up Rx

## 2014-04-26 ENCOUNTER — Other Ambulatory Visit: Payer: Self-pay | Admitting: *Deleted

## 2014-04-26 ENCOUNTER — Telehealth: Payer: Self-pay | Admitting: Orthopedic Surgery

## 2014-04-26 DIAGNOSIS — M25531 Pain in right wrist: Secondary | ICD-10-CM

## 2014-04-26 MED ORDER — HYDROCODONE-ACETAMINOPHEN 5-325 MG PO TABS: 1.0000 | ORAL_TABLET | Freq: Four times a day (QID) | ORAL | Status: DC | PRN

## 2014-04-26 NOTE — Telephone Encounter (Signed)
Patient is calling to request pain medication refill HYDROcodone-acetaminophen (NORCO/VICODIN) 5-325 MG per tablet please advise?

## 2014-04-26 NOTE — Telephone Encounter (Signed)
Prescription available, patient aware  

## 2014-04-28 NOTE — Telephone Encounter (Signed)
Patient picked up Rx

## 2014-04-28 NOTE — Telephone Encounter (Signed)
Close encounter 

## 2014-05-24 ENCOUNTER — Telehealth: Payer: Self-pay | Admitting: Orthopedic Surgery

## 2014-05-25 NOTE — Telephone Encounter (Signed)
Yes  norco 5 q8   # 30

## 2014-05-25 NOTE — Telephone Encounter (Signed)
Okay to refer? 

## 2014-05-26 ENCOUNTER — Other Ambulatory Visit: Payer: Self-pay | Admitting: *Deleted

## 2014-05-26 MED ORDER — HYDROCODONE-ACETAMINOPHEN 5-325 MG PO TABS: 1.0000 | ORAL_TABLET | Freq: Three times a day (TID) | ORAL | Status: DC | PRN

## 2014-05-26 NOTE — Telephone Encounter (Signed)
Prescription available, patient aware  

## 2014-05-28 ENCOUNTER — Telehealth: Payer: Self-pay | Admitting: *Deleted

## 2014-05-28 ENCOUNTER — Other Ambulatory Visit: Payer: Self-pay | Admitting: *Deleted

## 2014-05-28 DIAGNOSIS — S52531A Colles' fracture of right radius, initial encounter for closed fracture: Secondary | ICD-10-CM

## 2014-05-28 NOTE — Telephone Encounter (Signed)
PER PATIENT REQUEST REFERRAL SENT TO [facility] FOR SECOND OPINION

## 2014-06-04 NOTE — Telephone Encounter (Signed)
Pending review by Dr Orlan Leavensrtman

## 2014-06-09 ENCOUNTER — Other Ambulatory Visit: Payer: Self-pay | Admitting: *Deleted

## 2014-06-09 ENCOUNTER — Telehealth: Payer: Self-pay | Admitting: Orthopedic Surgery

## 2014-06-09 MED ORDER — HYDROCODONE-ACETAMINOPHEN 5-325 MG PO TABS: 1.0000 | ORAL_TABLET | Freq: Three times a day (TID) | ORAL | Status: DC | PRN

## 2014-06-09 NOTE — Telephone Encounter (Signed)
Patient called to request refill on medication: Hydrocodone-acetaminophen(Noco/Vicodin)5-325; chart note indicates that referral is pending for second orthopedic opinion.  Her ph# is [phone]

## 2014-06-10 NOTE — Telephone Encounter (Signed)
Prescription available, patient aware  

## 2014-06-14 NOTE — Telephone Encounter (Signed)
Patient picked up prescription (06/10/14).

## 2015-03-27 ENCOUNTER — Emergency Department (HOSPITAL_COMMUNITY): Payer: Medicare Other

## 2015-03-27 ENCOUNTER — Encounter (HOSPITAL_COMMUNITY): Payer: Self-pay | Admitting: Emergency Medicine

## 2015-03-27 ENCOUNTER — Emergency Department (HOSPITAL_COMMUNITY)
Admission: EM | Admit: 2015-03-27 | Discharge: 2015-03-27 | Disposition: A | Discharge status: Home / Self Care | Payer: Medicare Other | Attending: Emergency Medicine | Admitting: Emergency Medicine

## 2015-03-27 DIAGNOSIS — R9431 Abnormal electrocardiogram [ECG] [EKG]: Secondary | ICD-10-CM | POA: Diagnosis not present

## 2015-03-27 DIAGNOSIS — W01198A Fall on same level from slipping, tripping and stumbling with subsequent striking against other object, initial encounter: Secondary | ICD-10-CM | POA: Diagnosis not present

## 2015-03-27 DIAGNOSIS — Y9389 Activity, other specified: Secondary | ICD-10-CM | POA: Insufficient documentation

## 2015-03-27 DIAGNOSIS — Y9289 Other specified places as the place of occurrence of the external cause: Secondary | ICD-10-CM | POA: Diagnosis not present

## 2015-03-27 DIAGNOSIS — F17210 Nicotine dependence, cigarettes, uncomplicated: Secondary | ICD-10-CM | POA: Diagnosis not present

## 2015-03-27 DIAGNOSIS — S4292XA Fracture of left shoulder girdle, part unspecified, initial encounter for closed fracture: Secondary | ICD-10-CM

## 2015-03-27 DIAGNOSIS — E876 Hypokalemia: Secondary | ICD-10-CM

## 2015-03-27 DIAGNOSIS — S0990XA Unspecified injury of head, initial encounter: Secondary | ICD-10-CM | POA: Insufficient documentation

## 2015-03-27 DIAGNOSIS — Z7982 Long term (current) use of aspirin: Secondary | ICD-10-CM | POA: Insufficient documentation

## 2015-03-27 DIAGNOSIS — J449 Chronic obstructive pulmonary disease, unspecified: Secondary | ICD-10-CM | POA: Insufficient documentation

## 2015-03-27 DIAGNOSIS — S42292A Other displaced fracture of upper end of left humerus, initial encounter for closed fracture: Secondary | ICD-10-CM | POA: Diagnosis not present

## 2015-03-27 DIAGNOSIS — I10 Essential (primary) hypertension: Secondary | ICD-10-CM | POA: Diagnosis not present

## 2015-03-27 DIAGNOSIS — E119 Type 2 diabetes mellitus without complications: Secondary | ICD-10-CM | POA: Insufficient documentation

## 2015-03-27 DIAGNOSIS — S43005A Unspecified dislocation of left shoulder joint, initial encounter: Secondary | ICD-10-CM | POA: Diagnosis not present

## 2015-03-27 DIAGNOSIS — E7800 Pure hypercholesterolemia, unspecified: Secondary | ICD-10-CM | POA: Diagnosis not present

## 2015-03-27 DIAGNOSIS — S4992XA Unspecified injury of left shoulder and upper arm, initial encounter: Secondary | ICD-10-CM | POA: Diagnosis present

## 2015-03-27 DIAGNOSIS — S0083XA Contusion of other part of head, initial encounter: Secondary | ICD-10-CM | POA: Insufficient documentation

## 2015-03-27 DIAGNOSIS — Z79899 Other long term (current) drug therapy: Secondary | ICD-10-CM | POA: Insufficient documentation

## 2015-03-27 DIAGNOSIS — Y998 Other external cause status: Secondary | ICD-10-CM | POA: Insufficient documentation

## 2015-03-27 HISTORY — DX: Chronic obstructive pulmonary disease, unspecified: J44.9

## 2015-03-27 HISTORY — DX: Type 2 diabetes mellitus without complications: E11.9

## 2015-03-27 HISTORY — DX: Pure hypercholesterolemia, unspecified: E78.00

## 2015-03-27 LAB — CBC WITH DIFFERENTIAL/PLATELET
Basophils Absolute: 0 10*3/uL (ref 0.0–0.1)
Basophils Relative: 0 %
Eosinophils Absolute: 0 10*3/uL (ref 0.0–0.7)
Eosinophils Relative: 0 %
HEMATOCRIT: 44 % (ref 36.0–46.0)
HEMOGLOBIN: 15.3 g/dL — AB (ref 12.0–15.0)
LYMPHS ABS: 1.4 10*3/uL (ref 0.7–4.0)
LYMPHS PCT: 8 %
MCH: 31.9 pg (ref 26.0–34.0)
MCHC: 34.8 g/dL (ref 30.0–36.0)
MCV: 91.7 fL (ref 78.0–100.0)
MONOS PCT: 5 %
Monocytes Absolute: 0.8 10*3/uL (ref 0.1–1.0)
NEUTROS ABS: 15.7 10*3/uL — AB (ref 1.7–7.7)
NEUTROS PCT: 87 %
Platelets: 260 10*3/uL (ref 150–400)
RBC: 4.8 MIL/uL (ref 3.87–5.11)
RDW: 12.7 % (ref 11.5–15.5)
WBC: 18 10*3/uL — AB (ref 4.0–10.5)

## 2015-03-27 LAB — BASIC METABOLIC PANEL
Anion gap: 11 (ref 5–15)
BUN: 27 mg/dL — AB (ref 6–20)
CHLORIDE: 102 mmol/L (ref 101–111)
CO2: 26 mmol/L (ref 22–32)
CREATININE: 1.43 mg/dL — AB (ref 0.44–1.00)
Calcium: 9 mg/dL (ref 8.9–10.3)
GFR calc Af Amer: 40 mL/min — ABNORMAL LOW (ref >60)
GFR calc non Af Amer: 34 mL/min — ABNORMAL LOW (ref >60)
Glucose, Bld: 239 mg/dL — ABNORMAL HIGH (ref 65–99)
POTASSIUM: 2.7 mmol/L — AB (ref 3.5–5.1)
Sodium: 139 mmol/L (ref 135–145)

## 2015-03-27 LAB — CBG MONITORING, ED: Glucose-Capillary: 201 mg/dL — ABNORMAL HIGH (ref 65–99)

## 2015-03-27 LAB — POTASSIUM: POTASSIUM: 3.2 mmol/L — AB (ref 3.5–5.1)

## 2015-03-27 MED ORDER — IOHEXOL 350 MG/ML SOLN
80.0000 mL | Freq: Once | INTRAVENOUS | Status: AC | PRN
  Administered 2015-03-27: 100 mL via INTRAVENOUS

## 2015-03-27 MED ORDER — POTASSIUM CHLORIDE 10 MEQ/100ML IV SOLN
10.0000 meq | INTRAVENOUS | Status: AC
  Administered 2015-03-27 (×3): 10 meq via INTRAVENOUS
  Filled 2015-03-27 (×3): qty 100

## 2015-03-27 MED ORDER — PROPOFOL 10 MG/ML IV BOLUS
0.5000 mg/kg | Freq: Once | INTRAVENOUS | Status: AC
  Administered 2015-03-27: 32.9 mg via INTRAVENOUS
  Filled 2015-03-27: qty 20

## 2015-03-27 MED ORDER — OXYCODONE-ACETAMINOPHEN 5-325 MG PO TABS: 1.0000 | ORAL_TABLET | ORAL | Status: DC | PRN

## 2015-03-27 MED ORDER — OXYCODONE-ACETAMINOPHEN 5-325 MG PO TABS: ORAL_TABLET | ORAL | Status: DC

## 2015-03-27 MED ORDER — SODIUM CHLORIDE 0.9 % IJ SOLN
INTRAMUSCULAR | Status: AC
  Filled 2015-03-27: qty 45

## 2015-03-27 MED ORDER — SODIUM CHLORIDE 0.9 % IV BOLUS (SEPSIS)
1000.0000 mL | Freq: Once | INTRAVENOUS | Status: AC
  Administered 2015-03-27: 1000 mL via INTRAVENOUS

## 2015-03-27 MED ORDER — POTASSIUM CHLORIDE CRYS ER 20 MEQ PO TBCR
40.0000 meq | EXTENDED_RELEASE_TABLET | Freq: Once | ORAL | Status: AC
  Administered 2015-03-27: 40 meq via ORAL
  Filled 2015-03-27: qty 2

## 2015-03-27 MED ORDER — IPRATROPIUM-ALBUTEROL 0.5-2.5 (3) MG/3ML IN SOLN
3.0000 mL | Freq: Once | RESPIRATORY_TRACT | Status: AC
  Administered 2015-03-27: 3 mL via RESPIRATORY_TRACT
  Filled 2015-03-27: qty 3

## 2015-03-27 NOTE — ED Provider Notes (Signed)
Pt received at sign out pending CT-A LUE s/p reduction of fx-dislocation. CT-A reassuring. NMS continues intact LUE with strong radial pulse. Pt wants to go home. Dx and testing d/w pt.  Questions answered.  Verb understanding, agreeable to d/c home with outpt f/u with Ortho MD.    Ct Angio Up Extrem Left W/cm &/or Wo/cm 03/27/2015  CLINICAL DATA:  Left shoulder fracture, concern for vascular injury at fracture site. CT from earlier same day described hematoma in the left axilla related to previous dislocation. EXAM: CT ANGIOGRAPHY UPPER LEFT EXTREMITY TECHNIQUE: Multidetector CT images were performed according to standard protocol. Multiplaner CT image reconstructions were also generated. These include axial images obtained of the entire left upper extremity, from above the left shoulder to the left hand, after administration of intravascular contrast. CONTRAST:  OMNIPAQUE IOHEXOL 350 MG/ML SOLN COMPARISON:  Noncontrast CT examination of the left shoulder from earlier same day. FINDINGS: Left subclavian, axillary, and brachial arteries appear intact, patent, and normal in caliber throughout. No evidence of vascular injury. No active hemorrhage last extravasation identified. Evaluation of the more distal segments of the left upper extremity are difficult to definitively characterize due to inability to properly position the patient, however, the vasculature of the left forearm also appears grossly intact, patent and normal in configuration throughout. Again noted is the comminuted fracture of the left humeral neck, with impaction at the fracture site and apex lateral angulation, unchanged in alignment compared to the CT performed earlier today. Main component of the left humeral head remains well positioned relative to the glenoid fossa. No fracture identified within the left scapula. Overlying acromioclavicular joint space remains well aligned. The probable small fracture fragments described within the  joint space on the earlier exam are not convincingly seen on this study. The hematoma/fluid stranding in the left axilla is grossly unchanged in the interval. Emphysematous changes and scarring noted within the upper lungs bilaterally. There is a focal herniation of the lung between the anterior left fourth and fifth ribs, presumably chronic as there are no acute-appearing displaced rib fractures in this region. No pneumothorax. No pleural effusion or hemothorax. Heart size is upper normal. Scattered atherosclerotic changes noted along the walls of the normal-caliber thoracic aorta. No pericardial effusion. Coronary artery calcifications noted. On limited images of the upper abdomen, there is a left adrenal mass measuring 10 mm greatest dimension. Right adrenal gland is somewhat bulbous in configuration without discrete mass. Layering stone within the gallbladder neck region measures 1.9 cm. Gallbladder is mildly distended but otherwise unremarkable. Review of the MIP images confirms the above findings. IMPRESSION: 1. Left subclavian, axillary and brachial arteries are intact, patent and normal in configuration throughout. No evidence of vascular injury. No active hemorrhage/contrast extravasation. More peripheral arterial branches within the left upper extremity are difficult to definitively characterize but also appear intact, patent and normal in configuration. 2. Comminuted and angulated fracture of the left humeral neck, stable compared to left shoulder CT from earlier same day. 3. Emphysematous changes and scarring within the upper lungs bilaterally. A focal lung herniation between the anterior left fourth and fifth ribs is presumably chronic. No adjacent rib fracture seen. Has there been a previous chest tube placement in this area? No pneumothorax. No pleural effusion. 4. Coronary artery calcifications.  Heart size is upper normal. 5. Left adrenal mass measuring 10 mm. Consider follow-up adrenal protocol CT  in 2-3 months to ensure benignity. 6. Cholelithiasis without evidence of acute cholecystitis. These results were called by  telephone at the time of interpretation on 03/27/2015 at 6:45 pm to the emergency room physician who verbally acknowledged these results. Electronically Signed   By: Bary RichardStan  Maynard M.D.   On: 03/27/2015 19:03        Samuel JesterKathleen Sohrab Keelan, DO 03/27/15 1912

## 2015-03-27 NOTE — ED Notes (Signed)
Pt has called her brother and he is on his way.

## 2015-03-27 NOTE — ED Notes (Signed)
Pt discharged by Tenna DelaineLori Hutchens RN

## 2015-03-27 NOTE — Discharge Instructions (Signed)
Shoulder Fracture Follow up with Dr. Ophelia CharterYates on Tuesday.  Follow up with the cardiologist regarding your abnormal EKG. Return to the ED if you develop new or worsening symptoms. You have a fractured humerus (bone in the upper arm) at the shoulder just below the ball of the shoulder joint. Most of the time the bones of a broken shoulder are in an acceptable position. Usually the injury can be treated with a shoulder immobilizer or sling and swath bandage. These devices support the arm and prevent any shoulder movement. If the bones are not in a good position, then surgery is sometimes needed. Shoulder fractures usually cause swelling, pain, and discoloration around the upper arm initially. They heal in 8-12 weeks with proper treatment. Rest in bed or a reclining chair as long as your shoulder is very painful. Sitting up generally results in less pain at the fracture site. Do not remove your shoulder bandage until your caregiver approves. You may apply ice packs over the shoulder for 20-30 minutes every 2 hours for the next 2-3 days to reduce the pain and swelling. Use your pain medicine as prescribed.  SEEK IMMEDIATE MEDICAL CARE IF:  You develop severe shoulder pain unrelieved by rest and taking pain medicine.  You have pain, numbness, tingling, or weakness in the hand or wrist.  You develop shortness of breath, chest pain, severe weakness, or fainting.  You have severe pain with motion of the fingers or wrist. MAKE SURE YOU:   Understand these instructions.  Will watch your condition.  Will get help right away if you are not doing well or get worse.   This information is not intended to replace advice given to you by your health care provider. Make sure you discuss any questions you have with your health care provider.   Document Released: 05/03/2004 Document Revised: 06/18/2011 Document Reviewed: 07/14/2008 Elsevier Interactive Patient Education [employer] Elsevier Inc.

## 2015-03-27 NOTE — ED Notes (Addendum)
Pt fell from standing position this am. C/o pain to left shoulder/arm. Denies hitting head/loc.  Pt received a total of 10mg  morphine iv pta.

## 2015-03-27 NOTE — ED Provider Notes (Signed)
CSN: [id number]     Arrival date & time 03/27/15  4098 History  By signing my name below, I, Bethel Born, attest that this documentation has been prepared under the direction and in the presence of Glynn Octave, MD. Electronically Signed: Bethel Born, ED Scribe. 03/27/2015. 10:03 AM   Chief Complaint  Patient presents with  . Fall    The history is provided by the patient. No language interpreter was used.   Brought in by EMS, Terri Chen is a 80 y.o. female with history of DM, HTN, high cholesterol, and COPD who presents to the Emergency Department complaining of a fall from standing at home this morning. Pt denies dizziness or lightheadedness prior to the fall stating "I just fell". She struck her head on the floor but denies LOC.  Associated symptoms include headache and left shoulder/ arm pain. Pt received 10 mg of morphine by EMS but still complains of pain. Pt denies neck pain, back pain, chest pain, abdominal pain, SOB, and LE pain. She denies anticoagulation apart from aspirin. She lives alone.   Past Medical History  Diagnosis Date  . Diabetes mellitus without complication (HCC)   . Hypertension   . High cholesterol   . COPD (chronic obstructive pulmonary disease) [facility])    Past Surgical History  Procedure Laterality Date  . Lung surgery    . Abdominal hysterectomy     No family history on file. Social History  Substance Use Topics  . Smoking status: Current Every Day Smoker -- 0.50 packs/day    Types: Cigarettes  . Smokeless tobacco: Not on file  . Alcohol Use: No   OB History    No data available     Review of Systems 10 Systems reviewed and all are negative for acute change except as noted in the HPI.  Allergies  Review of patient's allergies indicates no known allergies.  Home Medications   Prior to Admission medications   Medication Sig Start Date End Date Taking? Authorizing Provider  amLODipine (NORVASC) 5 MG tablet Take 5 mg by mouth  daily.   Yes Historical Provider, MD  aspirin EC 81 MG tablet Take 81 mg by mouth daily.   Yes Historical Provider, MD  hydrochlorothiazide (HYDRODIURIL) 25 MG tablet Take 25 mg by mouth daily.   Yes Historical Provider, MD  metoprolol (LOPRESSOR) 100 MG tablet Take 100 mg by mouth daily.   Yes Historical Provider, MD  pravastatin (PRAVACHOL) 40 MG tablet Take 40 mg by mouth daily.   Yes Historical Provider, MD  oxyCODONE-acetaminophen (PERCOCET/ROXICET) 5-325 MG tablet Take 1 tablet by mouth every 4 (four) hours as needed for severe pain. 03/27/15   Glynn Octave, MD   BP 136/75 mmHg  Pulse 65  Temp(Src) 98.1 F (36.7 C) (Oral)  Resp 22  Ht 5\' 5"  (1.651 m)  Wt 145 lb (65.772 kg)  BMI 24.13 kg/m2  SpO2 92% Physical Exam  Constitutional: She is oriented to person, place, and time. She appears well-developed and well-nourished. No distress.  HENT:  Head: Normocephalic.  Mouth/Throat: Oropharynx is clear and moist. No oropharyngeal exudate.  Hematoma and ecchymosis to left forehead  Eyes: Conjunctivae and EOM are normal. Pupils are equal, round, and reactive to light.  Neck: Normal range of motion. Neck supple.  No meningismus.  Cardiovascular: Normal rate, regular rhythm, normal heart sounds and intact distal pulses.   No murmur heard. Pulmonary/Chest: Effort normal and breath sounds normal. No respiratory distress.  Abdominal: Soft. There is no tenderness.  There is no rebound and no guarding.  Musculoskeletal: She exhibits edema and tenderness.  Tenderness and swelling to left shoulder. Intact radial pulse. Intact cardinal hand movements. No TTP of the left elbow. FROM of left elbow. No cervical spine tenderness. No thoracic or lumbar spine tenderness.  Neurological: She is alert and oriented to person, place, and time. No cranial nerve deficit. She exhibits normal muscle tone. Coordination normal.  No ataxia on finger to nose bilaterally. No pronator drift. 5/5 strength  throughout. CN 2-12 intact.Equal grip strength. Sensation intact.   Skin: Skin is warm.  Psychiatric: She has a normal mood and affect. Her behavior is normal.  Nursing note and vitals reviewed.   ED Course  Reduction of dislocation Date/Time: 03/27/2015 12:19 PM Performed by: Glynn Octave Authorized by: Glynn Octave Consent: Verbal consent obtained. Written consent obtained. Risks and benefits: risks, benefits and alternatives were discussed Consent given by: patient Patient understanding: patient states understanding of the procedure being performed Patient consent: the patient's understanding of the procedure matches consent given Procedure consent: procedure consent matches procedure scheduled Relevant documents: relevant documents present and verified Test results: test results available and properly labeled Site marked: the operative site was marked Patient identity confirmed: provided demographic data Time out: Immediately prior to procedure a "time out" was called to verify the correct patient, procedure, equipment, support staff and site/side marked as required. Local anesthesia used: no Patient sedated: yes Sedation type: moderate (conscious) sedation Sedatives: propofol Sedation start date/time: 03/27/2015 11:30 AM Sedation end date/time: 03/27/2015 11:40 AM Vitals: Vital signs were monitored during sedation. Patient tolerance: Patient tolerated the procedure well with no immediate complications   (including critical care time) DIAGNOSTIC STUDIES: Oxygen Saturation is 92% on RA, adequate by my interpretation.    COORDINATION OF CARE: 8:52 AM Discussed treatment plan which includes CT scans of the head and cervical spine, left shoulder XR, and left humerus XR with pt at bedside and pt agreed to plan.  10:03 AM I re-evaluated the patient and provided an update on the results of her imaging.   Labs Review Labs Reviewed  CBC WITH DIFFERENTIAL/PLATELET -  Abnormal; Notable for the following:    WBC 18.0 (*)    Hemoglobin 15.3 (*)    Neutro Abs 15.7 (*)    All other components within normal limits  BASIC METABOLIC PANEL - Abnormal; Notable for the following:    Potassium 2.7 (*)    Glucose, Bld 239 (*)    BUN 27 (*)    Creatinine, Ser 1.43 (*)    GFR calc non Af Amer 34 (*)    GFR calc Af Amer 40 (*)    All other components within normal limits  POTASSIUM - Abnormal; Notable for the following:    Potassium 3.2 (*)    All other components within normal limits  CBG MONITORING, ED - Abnormal; Notable for the following:    Glucose-Capillary 201 (*)    All other components within normal limits    Imaging Review Ct Head Wo Contrast  03/27/2015  CLINICAL DATA:  80 year old female acute fall today with head and cervical spine injury, headache and cervical spine pain. Initial encounter. EXAM: CT HEAD WITHOUT CONTRAST CT CERVICAL SPINE WITHOUT CONTRAST TECHNIQUE: Multidetector CT imaging of the head and cervical spine was performed following the standard protocol without intravenous contrast. Multiplanar CT image reconstructions of the cervical spine were also generated. COMPARISON:  None. FINDINGS: CT HEAD FINDINGS Atrophy and chronic small-vessel white matter ischemic changes  are identified. No acute intracranial abnormalities are identified, including mass lesion or mass effect, hydrocephalus, extra-axial fluid collection, midline shift, hemorrhage, or acute infarction. The visualized bony calvarium is unremarkable. Mild left forehead soft tissue swelling is noted. CT CERVICAL SPINE FINDINGS Straightening of the normal cervical lordosis noted. There is no evidence of acute fracture, subluxation or prevertebral soft tissue swelling. Mild multilevel degenerative disc disease and spondylosis noted. No focal bony lesions are identified. Diffuse osteopenia is present. The soft tissue structures are unremarkable except for vascular calcifications.  IMPRESSION: No evidence of acute intracranial abnormality. Atrophy and chronic small-vessel white matter ischemic changes. Left forehead soft tissue swelling without fracture. No static evidence of acute bony injury to the cervical spine. Mild multilevel degenerative changes. Electronically Signed   By: Harmon Pier M.D.   On: 03/27/2015 10:35   Ct Cervical Spine Wo Contrast  03/27/2015  CLINICAL DATA:  80 year old female acute fall today with head and cervical spine injury, headache and cervical spine pain. Initial encounter. EXAM: CT HEAD WITHOUT CONTRAST CT CERVICAL SPINE WITHOUT CONTRAST TECHNIQUE: Multidetector CT imaging of the head and cervical spine was performed following the standard protocol without intravenous contrast. Multiplanar CT image reconstructions of the cervical spine were also generated. COMPARISON:  None. FINDINGS: CT HEAD FINDINGS Atrophy and chronic small-vessel white matter ischemic changes are identified. No acute intracranial abnormalities are identified, including mass lesion or mass effect, hydrocephalus, extra-axial fluid collection, midline shift, hemorrhage, or acute infarction. The visualized bony calvarium is unremarkable. Mild left forehead soft tissue swelling is noted. CT CERVICAL SPINE FINDINGS Straightening of the normal cervical lordosis noted. There is no evidence of acute fracture, subluxation or prevertebral soft tissue swelling. Mild multilevel degenerative disc disease and spondylosis noted. No focal bony lesions are identified. Diffuse osteopenia is present. The soft tissue structures are unremarkable except for vascular calcifications. IMPRESSION: No evidence of acute intracranial abnormality. Atrophy and chronic small-vessel white matter ischemic changes. Left forehead soft tissue swelling without fracture. No static evidence of acute bony injury to the cervical spine. Mild multilevel degenerative changes. Electronically Signed   By: Harmon Pier M.D.   On:  03/27/2015 10:35   Ct Shoulder Left Wo Contrast  03/27/2015  CLINICAL DATA:  Left shoulder fracture/dislocation postreduction. EXAM: CT OF THE LEFT SHOULDER WITHOUT CONTRAST TECHNIQUE: Multidetector CT imaging was performed according to the standard protocol. Multiplanar CT image reconstructions were also generated. COMPARISON:  Radiograph same date. FINDINGS: The bones are demineralized. The glenohumeral dislocation has been reduced. There is a comminuted fracture of the left humeral neck. This fracture remains impacted with apex lateral angulation. No involvement of the articular surface of the humeral head is demonstrated. However, the fracture does extend superiorly to involve both the tuberosities. There are probable small fracture fragments in the joint. In addition, there is chondrocalcinosis of the humeral head. There is a relatively small shoulder joint effusion. No evidence of glenoid or other scapular fracture. The acromioclavicular joint is intact. There is hematoma in the left axilla with high density surrounding the axillary vessels. Vascular injury cannot be excluded without contrast. Emphysematous and postsurgical changes are present at the left lung apex. There is scarring around the suture line. No mass lesion or other worrisome finding demonstrated within the left hemithorax. IMPRESSION: 1. Comminuted and angulated fracture of the left humeral neck with involvement of the tuberosities. The articular surface of the humeral head is spared. 2. The glenohumeral dislocation has been reduced. No evidence of osseous Bankart lesion. 3. Hematoma  in the left axilla from previous dislocation. Correlate clinically to exclude associated vascular injury. Electronically Signed   By: Carey Bullocks M.D.   On: 03/27/2015 13:16   Dg Chest Portable 1 View  03/27/2015  CLINICAL DATA:  Larey Seat from standing position this morning, LEFT shoulder and arm pain, COPD, hypertension, diabetes mellitus EXAM: PORTABLE  CHEST 1 VIEW COMPARISON:  Portable exam 1101 hours without priors for comparison. FINDINGS: Minimal enlargement of cardiac silhouette. Atherosclerotic calcification aorta. Mediastinal contours and pulmonary vascularity normal. Bibasilar atelectasis greater on LEFT. Upper lungs clear. No pleural effusion or pneumothorax. Bones demineralized. Displaced greater tuberosity fracture proximal LEFT humerus with LEFT glenohumeral dislocation. IMPRESSION: Enlargement of cardiac silhouette with bibasilar atelectasis greater on LEFT. Displaced greater tuberosity fracture LEFT humerus with LEFT glenohumeral dislocation. Electronically Signed   By: Ulyses Southward M.D.   On: 03/27/2015 11:38   Dg Shoulder Left  03/27/2015  CLINICAL DATA:  80 year old female -post reduction left shoulder fracture dislocation. EXAM: LEFT SHOULDER - 2+ VIEW COMPARISON:  None. FINDINGS: There has been interval relocation of the humeral head. A humeral head/neck fracture is noted with apex superior/lateral angulation. No other abnormalities identified. IMPRESSION: Relocation of the humeral head with angulated head/neck fracture noted. Electronically Signed   By: Harmon Pier M.D.   On: 03/27/2015 12:38   Dg Shoulder Left  03/27/2015  CLINICAL DATA:  Fall from standing position with left shoulder pain, initial encounter EXAM: LEFT SHOULDER - 2+ VIEW COMPARISON:  None. FINDINGS: There is a combination fracture dislocation of the left humeral head. The fracture appears to involves the anatomic neck as well as a focal displaced greater tuberosity fracture. Anterior inferior dislocation of the humeral head with respect to the glenoid is seen. No other focal abnormality is noted. IMPRESSION: Fracture dislocation of the left humeral head. Electronically Signed   By: Alcide Clever M.D.   On: 03/27/2015 10:04   Dg Humerus Left  03/27/2015  CLINICAL DATA:  Fall from standing position with left arm pain EXAM: LEFT HUMERUS - 2+ VIEW COMPARISON:  None.  FINDINGS: Fracture dislocation of the proximal left humerus is again seen. No other fractures are noted. No soft tissue changes are noted. IMPRESSION: Fracture dislocation of the proximal left humerus. Electronically Signed   By: Alcide Clever M.D.   On: 03/27/2015 10:07   I have personally reviewed and evaluated these images and lab results as part of my medical decision-making.   EKG Interpretation   Date/Time:  Sunday March 27 2015 15:52:04 EST Ventricular Rate:  66 PR Interval:  142 QRS Duration: 77 QT Interval:  424 QTC Calculation: 444 R Axis:   68 Text Interpretation:  Sinus rhythm Low voltage, precordial leads Abnormal  R-wave progression, early transition Nonspecific T abnormalities, anterior  leads QT improved Confirmed by Manus Gunning  MD, Atley Scarboro [phone]) on 03/27/2015  4:19:23 PM      MDM   Final diagnoses:  Shoulder fracture, left  Shoulder dislocation, left, initial encounter  Abnormal EKG  mechanical fall with head injury and left shoulder injury. Denies hitting head but has bruised to forehead. No blood thinner use.  Fracture dislocation of left shoulder. Neurovascularly intact. CT head and C spine negative. EKG with diffuse T wave inversions, no comparison.  She denies any CP or SOB.  Has been told she had abnormal EKG in past. Patient has different MRN [place], [place]gui - [id number] but no EKGs there either.   Discussed with Dr. Ophelia Charter of orthopedics who recommends attempt at reduction  even though patient's rotator cuff is likely weak due to her greater tuberosity fracture.  Procedural sedation Performed by: Glynn OctaveANCOUR, Tamantha Saline Consent: Verbal consent obtained. Risks and benefits: risks, benefits and alternatives were discussed Required items: required blood products, implants, devices, and special equipment available Patient identity confirmed: arm band and provided demographic data Time out: Immediately prior to procedure a "time out" was called to verify the correct  patient, procedure, equipment, support staff and site/side marked as required.  Sedation type: moderate (conscious) sedation NPO time confirmed and considedered  Sedatives: PROPOFOL  Physician Time at Bedside: 15  Vitals: Vital signs were monitored during sedation. Cardiac Monitor, pulse oximeter Patient tolerance: Patient tolerated the procedure well with no immediate complications. Comments: Pt with uneventful recovered. Returned to pre-procedural sedation baseline   Shoulder relocated as above. Discussed with Dr. Ophelia CharterYates who is satisfied with the reduction. Pulse remains intact after reduction. Labs show hypokalemia of 2.7.,   Replaced potassium to 3.3.  EKG changes persist.  D/w Dr. Anne FuSkains who does not recommend further workup at this time as patient has no CP or SOB.  Does not recommend checking troponin now.  Outpatient followup.  Dr. Ophelia CharterYates reviewed XRay and CT, sling and swathe, pain control, outpatient followup in office in 2 days. With hematoma on CT, CTA will be performed to rule out vascular injury. Radial pulse strong on exam.  Care transferred to Dr. Clarene DukeMcManus at shift change.     I personally performed the services described in this documentation, which was scribed in my presence. The recorded information has been reviewed and is accurate.    Glynn OctaveStephen Saidi Santacroce, MD 03/27/15 [phone]

## 2015-03-29 MED FILL — Oxycodone w/ Acetaminophen Tab 5-325 MG: ORAL | Qty: 6 | Status: AC

## 2015-03-30 ENCOUNTER — Encounter: Payer: Self-pay | Admitting: Orthopedic Surgery

## 2015-03-31 ENCOUNTER — Other Ambulatory Visit (HOSPITAL_COMMUNITY): Payer: Self-pay | Admitting: Orthopaedic Surgery

## 2015-04-01 ENCOUNTER — Ambulatory Visit (INDEPENDENT_AMBULATORY_CARE_PROVIDER_SITE_OTHER): Payer: Medicare Other | Admitting: Internal Medicine

## 2015-04-01 ENCOUNTER — Encounter: Payer: Self-pay | Admitting: Internal Medicine

## 2015-04-01 VITALS — BP 116/62 | HR 68 | Ht 65.0 in | Wt 149.0 lb

## 2015-04-01 DIAGNOSIS — I10 Essential (primary) hypertension: Secondary | ICD-10-CM

## 2015-04-01 DIAGNOSIS — E785 Hyperlipidemia, unspecified: Secondary | ICD-10-CM

## 2015-04-01 NOTE — Patient Instructions (Signed)
Your physician recommends that you schedule a follow-up appointment in: As Needed  Your physician recommends that you continue on your current medications as directed. Please refer to the Current Medication list given to you today.  If you need a refill on your cardiac medications before your next appointment, please call your pharmacy.  Thank you for choosing [facility]!    

## 2015-04-01 NOTE — Progress Notes (Signed)
Cardiology Office Note   Date:  04/01/2015   ID:  Terri Chen, DOB 11/14/1934, MRN [id number]  PCP:  Delorse Lek, MD  Cardiologist:   Dietrich Pates, MD   Pt presents for preop risk assessment     History of Present Illness: Terri Chen is a 80 y.o. female with no history of CAD   Pt cares for self  Washes dishes.  Cooks  Sweeps   [place] and hurt shoulder  CT of chest showed scatter atherherosclerotic plaques on cornary arteries   Pt denies CP  No SOB  No syncope or presyncope        Current Outpatient Prescriptions  Medication Sig Dispense Refill  . amLODipine (NORVASC) 5 MG tablet Take 5 mg by mouth daily.    Marland Kitchen aspirin EC 81 MG tablet Take 81 mg by mouth daily.    . hydrochlorothiazide (HYDRODIURIL) 25 MG tablet Take 25 mg by mouth daily.    . metoprolol (LOPRESSOR) 100 MG tablet Take 100 mg by mouth daily.    Marland Kitchen oxyCODONE-acetaminophen (PERCOCET/ROXICET) 5-325 MG tablet Take 1 tablet by mouth every 4 (four) hours as needed for severe pain. 15 tablet 0  . pravastatin (PRAVACHOL) 40 MG tablet Take 40 mg by mouth at bedtime.      No current facility-administered medications for this visit.    Allergies:   Review of patient's allergies indicates no known allergies.   Past Medical History  Diagnosis Date  . Diabetes mellitus   . 1979   . Diabetes mellitus without complication (HCC)   . Hypertension   . High cholesterol   . COPD (chronic obstructive pulmonary disease) [facility])     Past Surgical History  Procedure Laterality Date  . Lung surgery    . Knee surgery    . Lung surgery    . Abdominal hysterectomy       Social History:  The patient  reports that she has been smoking Cigarettes.  She has been smoking about 0.50 packs per day. She does not have any smokeless tobacco history on file. She reports that she does not drink alcohol or use illicit drugs.   Family History:  The patient's family history is not on file.    ROS:  Please see the  history of present illness. All other systems are reviewed and  Negative to the above problem except as noted.    PHYSICAL EXAM: VS:  BP 116/62 mmHg  Pulse 68  Ht  (1.651 m)  Wt 67.586 kg (149 lb)  BMI 24.79 kg/m2  SpO2 92%  GEN: Well nourished, well developed, in no acute distress HEENT: normal Neck: no JVD, carotid bruits, or masses Cardiac: RRR; no murmurs, rubs, or gallops,no edema  Respiratory:  clear to auscultation bilaterally, normal work of breathing GI: soft, nontender, nondistended, + BS  No hepatomegaly  MS: no deformity Moving all extremities   Skin: warm and dry, no rash Neuro:  Strength and sensation are intact Psych: euthymic mood, full affect   EKG:  EKG is ordered today.  SR 66  T wave inversion v2 to v5, III, AVF No old to compare    Lipid Panel No results found for: CHOL, TRIG, HDL, CHOLHDL, VLDL, LDLCALC, LDLDIRECT    Wt Readings from Last 3 Encounters:  04/01/15 67.586 kg (149 lb)  03/27/15 65.772 kg (145 lb)  12/10/13 70.308 kg (155 lb)      ASSESSMENT AND PLAN:  Preop risk assessment.  Pt with plaqing of CT  Is active enough though to show no active symptoms  Exam is unremarkable EKG with T wave changes as noted  But again, no sympotms with activity  OVreall I think she is at a rel low risk for major cardiac event with planned surgery   OK to porceed without further testing.   2.  HL COntinue statin  Goal of LDL less than 100  3.  HTN  Adequate control .   4  Tob abuse  COunselled on cessatoin      Signed, Dietrich PatesPaula Charlayne Vultaggio, MD  04/01/2015 10:08 AM    [facility] [address], [place], [place]  [address] Phone: [phone]; Fax: [phone]

## 2015-04-03 ENCOUNTER — Encounter: Payer: Self-pay | Admitting: Internal Medicine

## 2015-04-05 ENCOUNTER — Encounter (HOSPITAL_COMMUNITY): Payer: Self-pay | Admitting: *Deleted

## 2015-04-05 NOTE — Progress Notes (Signed)
Pt denies cardiac history, chest pain or sob. Did see Dr. Dietrich PatesPaula Ross on 04/01/15 because chest CT showed scattered plaque on coronary arteries. No further testing was done and Dr. Tenny Crawoss cleared her for surgery.   Pt states she has been taken off of diabetic medications "I'm not sure if I'm diabetic".   Called Dr. Kipp BroodBrent Burnett's office to get most recent A1C, last OV notes and copy of stress test from 2007.

## 2015-04-06 MED ORDER — CEFAZOLIN SODIUM-DEXTROSE 2-3 GM-% IV SOLR
2.0000 g | INTRAVENOUS | Status: AC
  Administered 2015-04-07: 2 g via INTRAVENOUS
  Filled 2015-04-06: qty 50

## 2015-04-06 MED ORDER — CHLORHEXIDINE GLUCONATE 4 % EX LIQD: 60.0000 mL | Freq: Once | CUTANEOUS | Status: DC

## 2015-04-07 ENCOUNTER — Inpatient Hospital Stay (HOSPITAL_COMMUNITY): Payer: Medicare Other

## 2015-04-07 ENCOUNTER — Inpatient Hospital Stay (HOSPITAL_COMMUNITY): Payer: Medicare Other | Admitting: Certified Registered Nurse Anesthetist

## 2015-04-07 ENCOUNTER — Inpatient Hospital Stay (HOSPITAL_COMMUNITY)
Admission: RE | Admit: 2015-04-07 | Discharge: 2015-04-08 | DRG: 494 | Disposition: A | Discharge status: Home Health | Payer: Medicare Other | Source: Ambulatory Visit | Attending: Orthopaedic Surgery | Admitting: Orthopaedic Surgery

## 2015-04-07 ENCOUNTER — Encounter (HOSPITAL_COMMUNITY)
Admission: RE | Disposition: A | Discharge status: Home Health | Payer: Self-pay | Source: Ambulatory Visit | Attending: Orthopaedic Surgery

## 2015-04-07 ENCOUNTER — Encounter (HOSPITAL_COMMUNITY): Payer: Self-pay | Admitting: Certified Registered Nurse Anesthetist

## 2015-04-07 DIAGNOSIS — Z419 Encounter for procedure for purposes other than remedying health state, unspecified: Secondary | ICD-10-CM

## 2015-04-07 DIAGNOSIS — Z23 Encounter for immunization: Secondary | ICD-10-CM | POA: Diagnosis not present

## 2015-04-07 DIAGNOSIS — E119 Type 2 diabetes mellitus without complications: Secondary | ICD-10-CM | POA: Diagnosis present

## 2015-04-07 DIAGNOSIS — Z79899 Other long term (current) drug therapy: Secondary | ICD-10-CM

## 2015-04-07 DIAGNOSIS — F17210 Nicotine dependence, cigarettes, uncomplicated: Secondary | ICD-10-CM | POA: Diagnosis not present

## 2015-04-07 DIAGNOSIS — Z8673 Personal history of transient ischemic attack (TIA), and cerebral infarction without residual deficits: Secondary | ICD-10-CM | POA: Diagnosis not present

## 2015-04-07 DIAGNOSIS — W19XXXA Unspecified fall, initial encounter: Secondary | ICD-10-CM | POA: Diagnosis not present

## 2015-04-07 DIAGNOSIS — S42202A Unspecified fracture of upper end of left humerus, initial encounter for closed fracture: Principal | ICD-10-CM | POA: Diagnosis present

## 2015-04-07 DIAGNOSIS — I10 Essential (primary) hypertension: Secondary | ICD-10-CM | POA: Diagnosis present

## 2015-04-07 DIAGNOSIS — J449 Chronic obstructive pulmonary disease, unspecified: Secondary | ICD-10-CM | POA: Diagnosis present

## 2015-04-07 DIAGNOSIS — S42209A Unspecified fracture of upper end of unspecified humerus, initial encounter for closed fracture: Secondary | ICD-10-CM | POA: Diagnosis present

## 2015-04-07 DIAGNOSIS — S42212A Unspecified displaced fracture of surgical neck of left humerus, initial encounter for closed fracture: Secondary | ICD-10-CM | POA: Diagnosis present

## 2015-04-07 HISTORY — PX: ORIF HUMERUS FRACTURE: SHX2126

## 2015-04-07 HISTORY — DX: Pneumonia, unspecified organism: J18.9

## 2015-04-07 HISTORY — DX: Anxiety disorder, unspecified: F41.9

## 2015-04-07 LAB — COMPREHENSIVE METABOLIC PANEL WITH GFR
ALT: 19 U/L (ref 14–54)
AST: 19 U/L (ref 15–41)
Albumin: 3.2 g/dL — ABNORMAL LOW (ref 3.5–5.0)
Alkaline Phosphatase: 73 U/L (ref 38–126)
Anion gap: 13 (ref 5–15)
BUN: 23 mg/dL — ABNORMAL HIGH (ref 6–20)
CO2: 29 mmol/L (ref 22–32)
Calcium: 9.6 mg/dL (ref 8.9–10.3)
Chloride: 93 mmol/L — ABNORMAL LOW (ref 101–111)
Creatinine, Ser: 1.47 mg/dL — ABNORMAL HIGH (ref 0.44–1.00)
GFR calc Af Amer: 38 mL/min — ABNORMAL LOW
GFR calc non Af Amer: 33 mL/min — ABNORMAL LOW
Glucose, Bld: 180 mg/dL — ABNORMAL HIGH (ref 65–99)
Potassium: 3.3 mmol/L — ABNORMAL LOW (ref 3.5–5.1)
Sodium: 135 mmol/L (ref 135–145)
Total Bilirubin: 1 mg/dL (ref 0.3–1.2)
Total Protein: 6.9 g/dL (ref 6.5–8.1)

## 2015-04-07 LAB — CBC
HCT: 43.5 % (ref 36.0–46.0)
Hemoglobin: 14.4 g/dL (ref 12.0–15.0)
MCH: 30.8 pg (ref 26.0–34.0)
MCHC: 33.1 g/dL (ref 30.0–36.0)
MCV: 92.9 fL (ref 78.0–100.0)
Platelets: 342 K/uL (ref 150–400)
RBC: 4.68 MIL/uL (ref 3.87–5.11)
RDW: 13 % (ref 11.5–15.5)
WBC: 14.5 K/uL — ABNORMAL HIGH (ref 4.0–10.5)

## 2015-04-07 LAB — PROTIME-INR
INR: 1.04 (ref 0.00–1.49)
Prothrombin Time: 13.8 s (ref 11.6–15.2)

## 2015-04-07 LAB — APTT: APTT: 27 s (ref 24–37)

## 2015-04-07 LAB — GLUCOSE, CAPILLARY
GLUCOSE-CAPILLARY: 175 mg/dL — AB (ref 65–99)
GLUCOSE-CAPILLARY: 145 mg/dL — AB (ref 65–99)
Glucose-Capillary: 154 mg/dL — ABNORMAL HIGH (ref 65–99)

## 2015-04-07 SURGERY — OPEN REDUCTION INTERNAL FIXATION (ORIF) PROXIMAL HUMERUS FRACTURE
Anesthesia: Regional | Laterality: Left

## 2015-04-07 MED ORDER — ONDANSETRON HCL 4 MG/2ML IJ SOLN
INTRAMUSCULAR | Status: AC
  Filled 2015-04-07: qty 2

## 2015-04-07 MED ORDER — ALBUTEROL SULFATE HFA 108 (90 BASE) MCG/ACT IN AERS
INHALATION_SPRAY | RESPIRATORY_TRACT | Status: DC | PRN
  Administered 2015-04-07 (×2): 6 via RESPIRATORY_TRACT

## 2015-04-07 MED ORDER — MENTHOL 3 MG MT LOZG: 1.0000 | LOZENGE | OROMUCOSAL | Status: DC | PRN

## 2015-04-07 MED ORDER — GUAIFENESIN ER 600 MG PO TB12
600.0000 mg | ORAL_TABLET | Freq: Two times a day (BID) | ORAL | Status: DC
  Administered 2015-04-07 – 2015-04-08 (×2): 600 mg via ORAL
  Filled 2015-04-07 (×2): qty 1

## 2015-04-07 MED ORDER — GLYCOPYRROLATE 0.2 MG/ML IJ SOLN
INTRAMUSCULAR | Status: DC | PRN
  Administered 2015-04-07 (×2): .2 mg via INTRAVENOUS

## 2015-04-07 MED ORDER — BUPIVACAINE HCL (PF) 0.5 % IJ SOLN
INTRAMUSCULAR | Status: AC
  Filled 2015-04-07: qty 30

## 2015-04-07 MED ORDER — MIDAZOLAM HCL 2 MG/2ML IJ SOLN
INTRAMUSCULAR | Status: AC
  Filled 2015-04-07: qty 2

## 2015-04-07 MED ORDER — AMLODIPINE BESYLATE 5 MG PO TABS
5.0000 mg | ORAL_TABLET | Freq: Every day | ORAL | Status: DC
  Administered 2015-04-08: 5 mg via ORAL
  Filled 2015-04-07: qty 1

## 2015-04-07 MED ORDER — HYDROCHLOROTHIAZIDE 25 MG PO TABS
25.0000 mg | ORAL_TABLET | Freq: Every day | ORAL | Status: DC
  Administered 2015-04-08: 25 mg via ORAL
  Filled 2015-04-07: qty 1

## 2015-04-07 MED ORDER — ROCURONIUM BROMIDE 50 MG/5ML IV SOLN
INTRAVENOUS | Status: AC
  Filled 2015-04-07: qty 1

## 2015-04-07 MED ORDER — ROPIVACAINE HCL 5 MG/ML IJ SOLN
INTRAMUSCULAR | Status: DC | PRN
  Administered 2015-04-07: 25 mL via PERINEURAL

## 2015-04-07 MED ORDER — LIDOCAINE HCL (CARDIAC) 20 MG/ML IV SOLN
INTRAVENOUS | Status: DC | PRN
  Administered 2015-04-07: 20 mg via INTRAVENOUS

## 2015-04-07 MED ORDER — INFLUENZA VAC SPLIT QUAD 0.5 ML IM SUSY
0.5000 mL | PREFILLED_SYRINGE | INTRAMUSCULAR | Status: AC
  Administered 2015-04-08: 0.5 mL via INTRAMUSCULAR
  Filled 2015-04-07: qty 0.5

## 2015-04-07 MED ORDER — SUGAMMADEX SODIUM 500 MG/5ML IV SOLN
INTRAVENOUS | Status: AC
  Filled 2015-04-07: qty 5

## 2015-04-07 MED ORDER — METOCLOPRAMIDE HCL 5 MG/ML IJ SOLN: 5.0000 mg | Freq: Three times a day (TID) | INTRAMUSCULAR | Status: DC | PRN

## 2015-04-07 MED ORDER — BUDESONIDE-FORMOTEROL FUMARATE 160-4.5 MCG/ACT IN AERO
2.0000 | INHALATION_SPRAY | Freq: Two times a day (BID) | RESPIRATORY_TRACT | Status: DC
  Filled 2015-04-07: qty 6

## 2015-04-07 MED ORDER — METHOCARBAMOL 500 MG PO TABS
500.0000 mg | ORAL_TABLET | Freq: Four times a day (QID) | ORAL | Status: DC | PRN
  Administered 2015-04-07 – 2015-04-08 (×3): 500 mg via ORAL
  Filled 2015-04-07 (×4): qty 1

## 2015-04-07 MED ORDER — HYDROCODONE-ACETAMINOPHEN 7.5-325 MG PO TABS: 1.0000 | ORAL_TABLET | Freq: Once | ORAL | Status: DC | PRN

## 2015-04-07 MED ORDER — FENTANYL CITRATE (PF) 100 MCG/2ML IJ SOLN
INTRAMUSCULAR | Status: DC | PRN
  Administered 2015-04-07: 100 ug via INTRAVENOUS
  Administered 2015-04-07: 50 ug via INTRAVENOUS
  Administered 2015-04-07: 100 ug via INTRAVENOUS

## 2015-04-07 MED ORDER — METHOCARBAMOL 1000 MG/10ML IJ SOLN
500.0000 mg | Freq: Four times a day (QID) | INTRAVENOUS | Status: DC | PRN
  Filled 2015-04-07: qty 5

## 2015-04-07 MED ORDER — HYDROMORPHONE HCL 1 MG/ML IJ SOLN: 0.2500 mg | INTRAMUSCULAR | Status: DC | PRN

## 2015-04-07 MED ORDER — ACETAMINOPHEN 650 MG RE SUPP: 650.0000 mg | Freq: Four times a day (QID) | RECTAL | Status: DC | PRN

## 2015-04-07 MED ORDER — ACETAMINOPHEN 325 MG PO TABS: 650.0000 mg | ORAL_TABLET | Freq: Four times a day (QID) | ORAL | Status: DC | PRN

## 2015-04-07 MED ORDER — CEFAZOLIN SODIUM 1-5 GM-% IV SOLN
1.0000 g | Freq: Three times a day (TID) | INTRAVENOUS | Status: AC
  Administered 2015-04-07 – 2015-04-08 (×2): 1 g via INTRAVENOUS
  Filled 2015-04-07 (×3): qty 50

## 2015-04-07 MED ORDER — SUGAMMADEX SODIUM 500 MG/5ML IV SOLN
INTRAVENOUS | Status: DC | PRN
  Administered 2015-04-07: 300 mg via INTRAVENOUS

## 2015-04-07 MED ORDER — POTASSIUM CHLORIDE IN NACL 20-0.45 MEQ/L-% IV SOLN
INTRAVENOUS | Status: DC
  Administered 2015-04-07 – 2015-04-08 (×2): via INTRAVENOUS
  Filled 2015-04-07 (×4): qty 1000

## 2015-04-07 MED ORDER — ONDANSETRON HCL 4 MG/2ML IJ SOLN
INTRAMUSCULAR | Status: DC | PRN
  Administered 2015-04-07: 4 mg via INTRAVENOUS

## 2015-04-07 MED ORDER — ALBUTEROL SULFATE HFA 108 (90 BASE) MCG/ACT IN AERS
INHALATION_SPRAY | RESPIRATORY_TRACT | Status: AC
  Filled 2015-04-07: qty 6.7

## 2015-04-07 MED ORDER — FENTANYL CITRATE (PF) 100 MCG/2ML IJ SOLN
INTRAMUSCULAR | Status: AC
  Administered 2015-04-07: 50 ug
  Filled 2015-04-07: qty 2

## 2015-04-07 MED ORDER — PHENOL 1.4 % MT LIQD
1.0000 | OROMUCOSAL | Status: DC | PRN
Start: 2015-04-07 — End: 2015-04-08

## 2015-04-07 MED ORDER — BUPIVACAINE HCL 0.5 % IJ SOLN
INTRAMUSCULAR | Status: DC | PRN
  Administered 2015-04-07: 20 mL

## 2015-04-07 MED ORDER — PRAVASTATIN SODIUM 40 MG PO TABS
40.0000 mg | ORAL_TABLET | Freq: Every day | ORAL | Status: DC
  Administered 2015-04-07: 40 mg via ORAL
  Filled 2015-04-07: qty 1

## 2015-04-07 MED ORDER — LACTATED RINGERS IV SOLN
INTRAVENOUS | Status: DC
  Administered 2015-04-07: 10:00:00 via INTRAVENOUS

## 2015-04-07 MED ORDER — PROMETHAZINE HCL 25 MG/ML IJ SOLN: 6.2500 mg | INTRAMUSCULAR | Status: DC | PRN

## 2015-04-07 MED ORDER — METOCLOPRAMIDE HCL 5 MG PO TABS: 5.0000 mg | ORAL_TABLET | Freq: Three times a day (TID) | ORAL | Status: DC | PRN

## 2015-04-07 MED ORDER — OXYCODONE HCL 5 MG PO TABS
5.0000 mg | ORAL_TABLET | Freq: Four times a day (QID) | ORAL | Status: DC | PRN
  Administered 2015-04-07 – 2015-04-08 (×3): 5 mg via ORAL
  Filled 2015-04-07 (×3): qty 1

## 2015-04-07 MED ORDER — ONDANSETRON HCL 4 MG/2ML IJ SOLN: 4.0000 mg | Freq: Four times a day (QID) | INTRAMUSCULAR | Status: DC | PRN

## 2015-04-07 MED ORDER — FENTANYL CITRATE (PF) 250 MCG/5ML IJ SOLN
INTRAMUSCULAR | Status: AC
  Filled 2015-04-07: qty 5

## 2015-04-07 MED ORDER — ROCURONIUM BROMIDE 100 MG/10ML IV SOLN
INTRAVENOUS | Status: DC | PRN
  Administered 2015-04-07: 30 mg via INTRAVENOUS

## 2015-04-07 MED ORDER — PHENYLEPHRINE 40 MCG/ML (10ML) SYRINGE FOR IV PUSH (FOR BLOOD PRESSURE SUPPORT)
PREFILLED_SYRINGE | INTRAVENOUS | Status: AC
  Filled 2015-04-07: qty 10

## 2015-04-07 MED ORDER — LIDOCAINE HCL (CARDIAC) 20 MG/ML IV SOLN
INTRAVENOUS | Status: AC
  Filled 2015-04-07: qty 5

## 2015-04-07 MED ORDER — PHENYLEPHRINE HCL 10 MG/ML IJ SOLN
10.0000 mg | INTRAMUSCULAR | Status: DC | PRN
  Administered 2015-04-07: 50 ug/min via INTRAVENOUS

## 2015-04-07 MED ORDER — ONDANSETRON HCL 4 MG PO TABS: 4.0000 mg | ORAL_TABLET | Freq: Four times a day (QID) | ORAL | Status: DC | PRN

## 2015-04-07 MED ORDER — DOCUSATE SODIUM 100 MG PO CAPS
100.0000 mg | ORAL_CAPSULE | Freq: Two times a day (BID) | ORAL | Status: DC
  Administered 2015-04-07 – 2015-04-08 (×2): 100 mg via ORAL
  Filled 2015-04-07 (×2): qty 1

## 2015-04-07 MED ORDER — METOPROLOL TARTRATE 100 MG PO TABS
100.0000 mg | ORAL_TABLET | Freq: Every day | ORAL | Status: DC
  Administered 2015-04-08: 100 mg via ORAL
  Filled 2015-04-07: qty 1

## 2015-04-07 MED ORDER — PROPOFOL 10 MG/ML IV BOLUS
INTRAVENOUS | Status: AC
  Filled 2015-04-07: qty 20

## 2015-04-07 MED ORDER — PROPOFOL 10 MG/ML IV BOLUS
INTRAVENOUS | Status: DC | PRN
Start: 2015-04-07 — End: 2015-04-07
  Administered 2015-04-07: 130 mg via INTRAVENOUS

## 2015-04-07 MED ORDER — 0.9 % SODIUM CHLORIDE (POUR BTL) OPTIME
TOPICAL | Status: DC | PRN
  Administered 2015-04-07: 1000 mL

## 2015-04-07 MED ORDER — DEXTROSE 50 % IV SOLN
INTRAVENOUS | Status: AC
  Filled 2015-04-07: qty 50

## 2015-04-07 SURGICAL SUPPLY — 58 items
BENZOIN TINCTURE PRP APPL 2/3 (GAUZE/BANDAGES/DRESSINGS) ×3 IMPLANT
BIT DRILL 3.2 (BIT) ×2
BIT DRILL 3.2XCALB NS DISP (BIT) ×1 IMPLANT
BIT DRILL CALIBRATED 2.7 (BIT) ×2 IMPLANT
BIT DRILL CALIBRATED 2.7MM (BIT) ×1
BIT DRL 3.2XCALB NS DISP (BIT) ×1
CLOSURE WOUND 1/2 X4 (GAUZE/BANDAGES/DRESSINGS) ×1
COVER SURGICAL LIGHT HANDLE (MISCELLANEOUS) ×3 IMPLANT
DRAPE C-ARM 42X72 X-RAY (DRAPES) ×3 IMPLANT
DRAPE IMP U-DRAPE 54X76 (DRAPES) ×3 IMPLANT
DRAPE INCISE IOBAN 66X45 STRL (DRAPES) ×3 IMPLANT
DRAPE SURG 17X23 STRL (DRAPES) ×3 IMPLANT
DRAPE U-SHAPE 47X51 STRL (DRAPES) ×3 IMPLANT
DRSG EMULSION OIL 3X3 NADH (GAUZE/BANDAGES/DRESSINGS) ×3 IMPLANT
DRSG MEPILEX BORDER 4X8 (GAUZE/BANDAGES/DRESSINGS) ×3 IMPLANT
ELECT REM PT RETURN 9FT ADLT (ELECTROSURGICAL) ×3
ELECTRODE REM PT RTRN 9FT ADLT (ELECTROSURGICAL) ×1 IMPLANT
GAUZE SPONGE 4X4 12PLY STRL (GAUZE/BANDAGES/DRESSINGS) ×3 IMPLANT
GLOVE BIOGEL PI IND STRL 8 (GLOVE) ×2 IMPLANT
GLOVE BIOGEL PI INDICATOR 8 (GLOVE) ×4
GLOVE ORTHO TXT STRL SZ7.5 (GLOVE) ×6 IMPLANT
GOWN STRL REUS W/ TWL LRG LVL3 (GOWN DISPOSABLE) ×1 IMPLANT
GOWN STRL REUS W/ TWL XL LVL3 (GOWN DISPOSABLE) ×1 IMPLANT
GOWN STRL REUS W/TWL 2XL LVL3 (GOWN DISPOSABLE) IMPLANT
GOWN STRL REUS W/TWL LRG LVL3 (GOWN DISPOSABLE) ×2
GOWN STRL REUS W/TWL XL LVL3 (GOWN DISPOSABLE) ×2
K-WIRE 2X5 SS THRDED S3 (WIRE) ×3
KIT BASIN OR (CUSTOM PROCEDURE TRAY) ×3 IMPLANT
KIT ROOM TURNOVER OR (KITS) ×3 IMPLANT
KWIRE 2X5 SS THRDED S3 (WIRE) ×1 IMPLANT
MANIFOLD NEPTUNE II (INSTRUMENTS) IMPLANT
NEEDLE HYPO 25GX1X1/2 BEV (NEEDLE) IMPLANT
NS IRRIG 1000ML POUR BTL (IV SOLUTION) ×3 IMPLANT
PACK SHOULDER (CUSTOM PROCEDURE TRAY) ×3 IMPLANT
PACK UNIVERSAL I (CUSTOM PROCEDURE TRAY) ×3 IMPLANT
PAD ARMBOARD 7.5X6 YLW CONV (MISCELLANEOUS) ×6 IMPLANT
PEG LOCKING 3.2MMX44 (Peg) ×3 IMPLANT
PEG LOCKING 3.2MMX46 (Peg) ×9 IMPLANT
PEG LOCKING 3.2X32 (Peg) ×3 IMPLANT
PEG LOCKING 3.2X36 (Screw) ×6 IMPLANT
PEG LOCKING 3.2X38 (Screw) ×3 IMPLANT
PEG LOCKING 3.2X52 (Peg) ×3 IMPLANT
PLATE PROX HUM HI L 3H 80 (Plate) ×3 IMPLANT
SCREW LP NL T15 3.5X24 (Screw) ×3 IMPLANT
SCREW LP NL T15 3.5X26 (Screw) ×6 IMPLANT
SLEEVE MEASURING 3.2 (BIT) ×3 IMPLANT
SPONGE LAP 18X18 X RAY DECT (DISPOSABLE) ×6 IMPLANT
STAPLER VISISTAT 35W (STAPLE) ×3 IMPLANT
STRIP CLOSURE SKIN 1/2X4 (GAUZE/BANDAGES/DRESSINGS) ×2 IMPLANT
SUCTION FRAZIER TIP 10 FR DISP (SUCTIONS) ×3 IMPLANT
SUT FIBERWIRE #2 38 T-5 BLUE (SUTURE)
SUT VIC AB 2-0 CT1 27 (SUTURE) ×4
SUT VIC AB 2-0 CT1 TAPERPNT 27 (SUTURE) ×2 IMPLANT
SUT VIC AB 3-0 FS2 27 (SUTURE) ×3 IMPLANT
SUTURE FIBERWR #2 38 T-5 BLUE (SUTURE) IMPLANT
SYR CONTROL 10ML LL (SYRINGE) IMPLANT
TOWEL NATURAL 10PK STERILE (DISPOSABLE) ×6 IMPLANT
WATER STERILE IRR 1000ML POUR (IV SOLUTION) IMPLANT

## 2015-04-07 NOTE — Care Management (Signed)
Utilization review completed. Reinhardt Licausi, RN Case Manager [phone]. 
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
## 2015-04-07 NOTE — Brief Op Note (Signed)
04/07/2015  2:44 PM  PATIENT:  Terri Chen  80 y.o. female  PRE-OPERATIVE DIAGNOSIS:  Left Proximal Humerus Fracture  POST-OPERATIVE DIAGNOSIS:  Left Proximal Humerus Fracture  PROCEDURE:  Procedure(s): OPEN REDUCTION INTERNAL FIXATION (ORIF) PROXIMAL HUMERUS FRACTURE (Left)  SURGEON:  Surgeon(s) and Role:    * Eldred MangesMark C Yates, MD - Primary  PHYSICIAN ASSISTANT: Cecile Gillispie m. Barry Dienesowens   ANESTHESIA:   general  EBL:  Total I/O In: 800 [I.V.:800] Out: -   BLOOD ADMINISTERED:none  DRAINS: none   LOCAL MEDICATIONS USED:  MARCAINE     SPECIMEN:  No Specimen  DISPOSITION OF SPECIMEN:  N/A  COUNTS:  YES  TOURNIQUET:  * No tourniquets in log *  DICTATION: .Dragon Dictation  PLAN OF CARE: Admit to inpatient   PATIENT DISPOSITION:  PACU - hemodynamically stable.

## 2015-04-07 NOTE — Interval H&P Note (Signed)
History and Physical Interval Note:  04/07/2015 1:04 PM  Terri Chen  has presented today for surgery, with the diagnosis of Left Proximal Humerus Fracture  The various methods of treatment have been discussed with the patient and family. After consideration of risks, benefits and other options for treatment, the patient has consented to  Procedure(s): OPEN REDUCTION INTERNAL FIXATION (ORIF) PROXIMAL HUMERUS FRACTURE (Left) as a surgical intervention .  The patient's history has been reviewed, patient examined, no change in status, stable for surgery.  I have reviewed the patient's chart and labs.  Questions were answered to the patient's satisfaction.     Everest Brod C

## 2015-04-07 NOTE — Progress Notes (Signed)
CRNA at bedside.

## 2015-04-07 NOTE — Progress Notes (Signed)
Report to Dahlia Bailiffobin Roberts, RN as primary

## 2015-04-07 NOTE — Transfer of Care (Signed)
Immediate Anesthesia Transfer of Care Note  Patient: Terri Chen  Procedure(s) Performed: Procedure(s): OPEN REDUCTION INTERNAL FIXATION (ORIF) PROXIMAL HUMERUS FRACTURE (Left)  Patient Location: PACU  Anesthesia Type:General  Level of Consciousness: awake, alert , oriented and patient cooperative  Airway & Oxygen Therapy: Patient Spontanous Breathing and Patient connected to nasal cannula oxygen  Post-op Assessment: Report given to RN and Post -op Vital signs reviewed and stable  Post vital signs: Reviewed and stable  Last Vitals:  Filed Vitals:   04/07/15 1200 04/07/15 1210  BP: 148/63 137/58  Pulse: 66 62  Temp:    Resp: 16 14    Complications: No apparent anesthesia complications

## 2015-04-07 NOTE — Anesthesia Preprocedure Evaluation (Addendum)
Anesthesia Evaluation  Patient identified by MRN, date of birth, ID band Patient awake    Reviewed: Allergy & Precautions, NPO status , Patient's Chart, lab work & pertinent test results, reviewed documented beta blocker date and time   Airway Mallampati: II  TM Distance: >3 FB Neck ROM: Full    Dental  (+) Dental Advisory Given, Edentulous Upper, Edentulous Lower   Pulmonary COPD,  COPD inhaler, Current Smoker,    breath sounds clear to auscultation       Cardiovascular hypertension, Pt. on medications and Pt. on home beta blockers  Rhythm:Regular Rate:Normal     Neuro/Psych Anxiety CVA (L side weakness), Residual Symptoms    GI/Hepatic negative GI ROS, Neg liver ROS,   Endo/Other  diabetes, Type 2  Renal/GU Renal InsufficiencyRenal diseasenegative Renal ROS     Musculoskeletal   Abdominal   Peds  Hematology negative hematology ROS (+)   Anesthesia Other Findings   Reproductive/Obstetrics                           Lab Results  Component Value Date   WBC 14.5* 04/07/2015   HGB 14.4 04/07/2015   HCT 43.5 04/07/2015   MCV 92.9 04/07/2015   PLT 342 04/07/2015   Lab Results  Component Value Date   CREATININE 1.47* 04/07/2015   BUN 23* 04/07/2015   NA 135 04/07/2015   K 3.3* 04/07/2015   CL 93* 04/07/2015   CO2 29 04/07/2015    Anesthesia Physical Anesthesia Plan  ASA: III  Anesthesia Plan: General and Regional   Post-op Pain Management: GA combined w/ Regional for post-op pain   Induction: Intravenous  Airway Management Planned: Oral ETT  Additional Equipment:   Intra-op Plan:   Post-operative Plan: Extubation in OR  Informed Consent: I have reviewed the patients History and Physical, chart, labs and discussed the procedure including the risks, benefits and alternatives for the proposed anesthesia with the patient or authorized representative who has indicated his/her  understanding and acceptance.   Dental advisory given  Plan Discussed with: CRNA  Anesthesia Plan Comments:         Anesthesia Quick Evaluation

## 2015-04-07 NOTE — Op Note (Signed)
NAME:  [name], Terri Chen                 ACCOUNT NO.:  [id number]  MEDICAL RECORD NO.:  [id number]  LOCATION:  5N06C                        FACILITY:  [facility]  PHYSICIAN:  Jeraline Marcinek C. Ophelia CharterYates, M.D.    DATE OF BIRTH:  May 28, 1934  DATE OF PROCEDURE:  04/07/2015 DATE OF DISCHARGE:                              OPERATIVE REPORT   PREOPERATIVE DIAGNOSIS:  Left proximal humerus fracture.  POSTOPERATIVE DIAGNOSIS:  Left proximal humerus fracture.  PROCEDURE:  Open reduction and internal fixation of left proximal humerus fracture.  SURGEON:  Torie Towle C. Ophelia CharterYates, M.D.  ASSISTANT:  Genene ChurnJames M. Barry Dieneswens, PA-C, medically necessary and present for the entire procedure.  ANESTHESIA:  General plus preoperative block.  ESTIMATED BLOOD LOSS:  Less than 100 mL.  IMPLANTS:  Biomet anatomic proximal humerus plate high-profile, short.  DESCRIPTION OF PROCEDURE:  After induction of general anesthesia, preoperative Ancef prophylaxis, time-out procedure after prepping and draping, split sheets, drapes, beach chair position.  Sterile skin marker for deltopectoral incision, Betadine Steri-Drape, impervious stockinette, Coban had been applied.  Deltopectoral incision was made. Cephalic vein was taken laterally with the arm.  Short Willeen CassBennett was placed underneath the deltoid and reduction was performed using some folded towels in the axilla to help lateralize the shaft and traction. This was checked intermittently with C-arm.  Gradually, reduction was performed with improvement.  There was still trace varus.  Plate was fashioned to the shaft.  Continued attempted reduction would come close to anatomic.  K-wire was drilled up the plate, center-center in the head and then the distal shaft was fixed with a bicortical screw.  Second screw was placed, tightened down securely.  Pegs were drilled, measured and then placed, varying between 28 and 52-mm smooth pegs.  The proximal portion of the plate was slightly off the bone, a few mm.   Arm was able to be abducted to 120 before looked like it would approach the chromium. Internal and external rotation was checked under the fluoroscopy with lateral x-ray showing good position and alignment, and ball was firmly secured to the shaft.  Irrigation with saline solution.  2-0 Vicryl in the subcutaneous tissue, skin staple closure, Marcaine infiltration, postop dressing and sling. Instrument count and needle counts were correct.     Deeksha Cotrell C. Ophelia CharterYates, M.D.     MCY/MEDQ  D:  04/07/2015  T:  04/07/2015  Job:  [id number]
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
## 2015-04-07 NOTE — Anesthesia Procedure Notes (Addendum)
Anesthesia Regional Block:  Supraclavicular block  Pre-Anesthetic Checklist: ,, timeout performed, Correct Patient, Correct Site, Correct Laterality, Correct Procedure, Correct Position, site marked, Risks and benefits discussed,  Surgical consent,  Pre-op evaluation,  At surgeon's request and post-op pain management  Laterality: Left  Prep: chloraprep       Needles:  Injection technique: Single-shot  Needle Type: Echogenic Stimulator Needle     Needle Length: 9cm 9 cm Needle Gauge: 21 and 21 G    Additional Needles:  Procedures: ultrasound guided (picture in chart) and nerve stimulator Supraclavicular block  Nerve Stimulator or Paresthesia:  Response: deltoid, 0.5 mA,   Additional Responses:   Narrative:  Start time: 04/07/2015 11:50 AM End time: 04/07/2015 12:00 PM Injection made incrementally with aspirations every 5 mL.  Performed by: Personally  Anesthesiologist: Marcene DuosFITZGERALD, ROBERT  Additional Notes: Risks, benefits and alternative to block explained extensively.  Patient tolerated procedure well, without complications.   Procedure Name: Intubation Date/Time: 04/07/2015 1:19 PM Performed by: Faustino CongressWHITE, Raad Clayson TENA Narya Beavin Pre-anesthesia Checklist: Patient identified, Emergency Drugs available, Suction available and Patient being monitored Patient Re-evaluated:Patient Re-evaluated prior to inductionOxygen Delivery Method: Circle system utilized Preoxygenation: Pre-oxygenation with 100% oxygen Intubation Type: IV induction Ventilation: Mask ventilation without difficulty Laryngoscope Size: Mac and 3 Grade View: Grade I Tube type: Oral Tube size: 7.5 mm Number of attempts: 1 Airway Equipment and Method: Stylet Placement Confirmation: ETT inserted through vocal cords under direct vision,  positive ETCO2 and breath sounds checked- equal and bilateral Secured at: 22 cm Tube secured with: Tape Dental Injury: Teeth and Oropharynx as per pre-operative assessment

## 2015-04-07 NOTE — Care Management Note (Signed)
Case Management Note  Patient Details  Name: [name] Faye K Kotecki MRN: [id number] Date of Birth: 07/18/1934  Subjective/Objective:  80 yr old female admitted s/p fall with a left humeral neck fracture. Patient underwent a left humeral ORIF.                   Action/Plan: Case manager will continue to monitor for discharge needs,   Expected Discharge Date:                  Expected Discharge Plan:   to be determined.  In-House Referral:     Discharge planning Services     Post Acute Care Choice:    Choice offered to:     DME Arranged:    DME Agency:     HH Arranged:    HH Agency:     Status of Service:     Medicare Important Message Given:    Date Medicare IM Given:    Medicare IM give by:    Date Additional Medicare IM Given:    Additional Medicare Important Message give by:     If discussed at Long Length of Stay Meetings, dates discussed:    Additional Comments:  Durenda GuthrieBrady, Malacki Mcphearson Naomi, RN 04/07/2015, 3:55 PM

## 2015-04-08 ENCOUNTER — Encounter (HOSPITAL_COMMUNITY): Payer: Self-pay | Admitting: Orthopaedic Surgery

## 2015-04-08 DIAGNOSIS — S42202A Unspecified fracture of upper end of left humerus, initial encounter for closed fracture: Secondary | ICD-10-CM | POA: Diagnosis not present

## 2015-04-08 LAB — HEMOGLOBIN A1C
Hgb A1c MFr Bld: 8.2 % — ABNORMAL HIGH (ref 4.8–5.6)
Mean Plasma Glucose: 189 mg/dL

## 2015-04-08 MED ORDER — OXYCODONE-ACETAMINOPHEN 5-325 MG PO TABS: ORAL_TABLET | ORAL | Status: DC

## 2015-04-08 MED ORDER — OXYCODONE HCL 5 MG PO TABS
10.0000 mg | ORAL_TABLET | Freq: Four times a day (QID) | ORAL | Status: DC | PRN
  Filled 2015-04-08: qty 2

## 2015-04-08 NOTE — Discharge Instructions (Signed)
Leave sling on . May use ice on and off to shoulder to help pain .   See Dr.Aliene Tamura in one week. Work on finger and fist open and close to help with finger stiffness.

## 2015-04-08 NOTE — Evaluation (Signed)
Occupational Therapy Evaluation Patient Details Name: Terri Chen K Curtiss MRN: [id number] DOB: 1934/10/11 Today's Date: 04/08/2015    History of Present Illness 80 y.o. female admitted to [facility] on 04/07/15 for ORIF to left humerus from a fall sustained on 03/27/15.  Pt in now NWB left arm in a sling.  Pt with significant PMHx of DM, HTN, COPD (not on any home O2), HTN, anxiety, lung surgery, and R knee surgery (arthroscopic).   Clinical Impression   Pt reports she was independent with ADLs and mobility PTA. Currently pt is overall mod-min assist for functional mobility and ADLs. Began safety and ADL education with pt; she verbalized understanding. Currently pt is very unsteady on her feet and has difficulty performing transfers without external physical assist provided; pt reports numerous falls within the past year, "more than she can count". Pt planning to d/c home with 24/7 supervision from her 78 y.o. grandson. Recommending HHOT for further rehab upon return home to maximize independence and safety with ADLs and functional mobility. Pt would benefit from continued skilled OT in order to increase independence and safety with UB ADLs, toilet transfers, and ADLs in standing.     Follow Up Recommendations  Supervision/Assistance - 24 hour;Home health OT    Equipment Recommendations  None recommended by OT    Recommendations for Other Services       Precautions / Restrictions Precautions Precautions: Fall;Shoulder Type of Shoulder Precautions: None specified in orders Shoulder Interventions: Shoulder sling/immobilizer;At all times Precaution Booklet Issued: Yes (comment) Precaution Comments: Pt with h/o fall. Reviewed precautions with pt  Required Braces or Orthoses: Sling Restrictions Weight Bearing Restrictions: Yes LUE Weight Bearing: Non weight bearing      Mobility Bed Mobility Overal bed mobility: Needs Assistance Bed Mobility: Rolling;Sidelying to Sit Rolling: Min  assist Sidelying to sit: Min assist       General bed mobility comments: Pt OOB in chair  Transfers Overall transfer level: Needs assistance Equipment used: 1 person hand held assist Transfers: Sit to/from Stand Sit to Stand: Mod assist         General transfer comment: Mod hand held assist to support during transfers. Attempted sit to stand from chair x 3 with one successful attempt. VC for hand placement and technique.     Balance Overall balance assessment: Needs assistance Sitting-balance support: Feet supported Sitting balance-Leahy Scale: Fair Sitting balance - Comments: supervision (+) sway and tendancy towards posterior LOB when first sitting up Postural control: Posterior lean Standing balance support: Single extremity supported Standing balance-Leahy Scale: Poor Standing balance comment: needs external assist for balance in standing.                             ADL Overall ADL's : Needs assistance/impaired Eating/Feeding: Set up;Sitting   Grooming: Set up;Sitting   Upper Body Bathing: Moderate assistance;Sitting Upper Body Bathing Details (indicate cue type and reason): Educated on UB bathing technique Lower Body Bathing: Moderate assistance;Sit to/from stand   Upper Body Dressing : Moderate assistance;Sitting Upper Body Dressing Details (indicate cue type and reason): Educated on UB dressing technique Lower Body Dressing: Moderate assistance;Sit to/from stand   Toilet Transfer: Moderate assistance;Ambulation;Regular Toilet   Toileting- ArchitectClothing Manipulation and Hygiene: Moderate assistance;Sit to/from stand       Functional mobility during ADLs: Minimal assistance General ADL Comments: Pts brother in law present for OT eval. Educated on home safety, need for 24/7 assist, sling management and wear schedule, ice  for edema and pain, finger and wrist ROM exercises, NWB on LUE, shoulder precautions; pt verbalized understanding. Pt very off balance  when performing sit to stand and in standing; educated on need for assist any time that she is up and moving about her house. Pt reports grandson will be able to provide that level of assist upon return home.     Vision     Perception     Praxis      Pertinent Vitals/Pain Pain Assessment: Faces Pain Score: 5  Faces Pain Scale: Hurts even more Pain Location: L shoulder Pain Descriptors / Indicators: Aching;Burning Pain Intervention(s): Limited activity within patient's tolerance;Monitored during session;Repositioned;Ice applied     Hand Dominance Right   Extremity/Trunk Assessment Upper Extremity Assessment Upper Extremity Assessment: LUE deficits/detail LUE Deficits / Details: Finger and wrist AROM WFL. Pt reports pain in third digit with full flexion. Unable to further assess elbow/shoulder due to immobilization LUE: Unable to fully assess due to immobilization;Unable to fully assess due to pain   Lower Extremity Assessment Lower Extremity Assessment: Defer to PT evaluation   Cervical / Trunk Assessment Cervical / Trunk Assessment: Normal   Communication Communication Communication: HOH   Cognition Arousal/Alertness: Awake/alert Behavior During Therapy: WFL for tasks assessed/performed Overall Cognitive Status: Within Functional Limits for tasks assessed                     General Comments       Exercises Exercises: Shoulder Other Exercises Other Exercises: incintive spirometer x 5 reps with cues for correct form.    Shoulder Instructions Shoulder Instructions Donning/doffing shirt without moving shoulder: Moderate assistance (educated) Method for sponge bathing under operated UE: Moderate assistance (educated) Donning/doffing sling/immobilizer: Maximal assistance (educated) Correct positioning of sling/immobilizer: Moderate assistance (educated) ROM for elbow, wrist and digits of operated UE: Minimal assistance (educated) Sling wearing schedule (on at  all times/off for ADL's): Modified independent (educated) Proper positioning of operated UE when showering: Minimal assistance (educated) Positioning of UE while sleeping: Moderate assistance (educated)    Home Living Family/patient expects to be discharged to:: Private residence Living Arrangements: Other relatives (44 y.o. grandson who doesnt work) Available Help at Discharge: Family;Available 24 hours/day Type of Home: House Home Access: Level entry     Home Layout: One level     Bathroom Shower/Tub: Tub/shower unit;Curtain Shower/tub characteristics: Engineer, building services: Standard Bathroom Accessibility:  (doesnt know)   Home Equipment: Cane - single point;Walker - 2 wheels;Other (comment)          Prior Functioning/Environment Level of Independence: Independent (pt furniture walks)             OT Diagnosis: Generalized weakness;Acute pain   OT Problem List: Decreased strength;Decreased range of motion;Impaired balance (sitting and/or standing);Decreased safety awareness;Decreased knowledge of use of DME or AE;Decreased knowledge of precautions;Impaired UE functional use;Pain   OT Treatment/Interventions: Self-care/ADL training;Therapeutic exercise;DME and/or AE instruction;Patient/family education    OT Goals(Current goals can be found in the care plan section) Acute Rehab OT Goals Patient Stated Goal: to go home today OT Goal Formulation: With patient Time For Goal Achievement: 04/22/15 Potential to Achieve Goals: Fair ADL Goals Pt Will Perform Grooming: with supervision;standing Pt Will Perform Upper Body Bathing: with supervision;sitting Pt Will Perform Upper Body Dressing: with supervision;sitting Pt Will Perform Lower Body Dressing: with supervision;sit to/from stand Pt Will Transfer to Toilet: with supervision;ambulating;regular height toilet Pt Will Perform Toileting - Clothing Manipulation and hygiene: with supervision;sit to/from stand  OT  Frequency: Min 2X/week   Barriers to D/C:            Co-evaluation              End of Session Equipment Utilized During Treatment: Other (comment) (sling) Nurse Communication: Mobility status  Activity Tolerance: Patient tolerated treatment well Patient left: in chair;with call bell/phone within reach;with family/visitor present   Time: 1346-1410 OT Time Calculation (min): 24 min Charges:  OT General Charges $OT Visit: 1 Procedure OT Evaluation $Initial OT Evaluation Tier I: 1 Procedure OT Treatments $Self Care/Home Management : 8-22 mins G-Codes:     BaileyGaye AlkenS., OTR/L Pager: [phone]  04/08/2015, 2:57 PM

## 2015-04-08 NOTE — Progress Notes (Signed)
Subjective: 1 Day Post-Op Procedure(s) (LRB): OPEN REDUCTION INTERNAL FIXATION (ORIF) PROXIMAL HUMERUS FRACTURE (Left) Patient reports pain as moderate.    Objective: Vital signs in last 24 hours: Temp:  [97 F (36.1 C)-98.4 F (36.9 C)] 98.2 F (36.8 C) (12/30 0952) Pulse Rate:  [62-89] 72 (12/30 0952) Resp:  [9-16] 16 (12/30 0554) BP: (97-148)/(51-79) 142/63 mmHg (12/30 0952) SpO2:  [80 %-100 %] 90 % (12/30 0952)  Intake/Output from previous day: 12/29 0701 - 12/30 0700 In: 2081.3 [I.V.:1981.3; IV Piggyback:100] Out: 200 [Urine:200] Intake/Output this shift: Total I/O In: -  Out: 200 [Urine:200]   Recent Labs  04/07/15 1013  HGB 14.4    Recent Labs  04/07/15 1013  WBC 14.5*  RBC 4.68  HCT 43.5  PLT 342    Recent Labs  04/07/15 1013  NA 135  K 3.3*  CL 93*  CO2 29  BUN 23*  CREATININE 1.47*  GLUCOSE 180*  CALCIUM 9.6    Recent Labs  04/07/15 1013  INR 1.04    Neurologically intact  Assessment/Plan: 1 Day Post-Op Procedure(s) (LRB): OPEN REDUCTION INTERNAL FIXATION (ORIF) PROXIMAL HUMERUS FRACTURE (Left) Up with therapy to ambulate then D/C home today. HHPT if they think it is needed.   Terri Chen C 04/08/2015, 10:21 AM

## 2015-04-08 NOTE — Evaluation (Signed)
Physical Therapy Evaluation Patient Details Name: Terri Chen MRN: [id number] DOB: [birth year] Today's Date: 04/08/2015   History of Present Illness  80 y.o. female admitted to [facility] on 04/07/15 for ORIF to left humerus from a fall sustained on 03/27/15.  Pt in now NWB left arm in a sling.  Pt with significant PMHx of DM, HTN, COPD (not on any home O2), HTN, anxiety, lung surgery, and R knee surgery (arthroscopic).  Clinical Impression  Pt was able to get up and walk a very short distance into the hallway with PT's min hand held assist.  I reinforced to both pt and family member in the room that if she goes home (today or tomorrow) she will need someone with her any time she is up on her feet and moving to provide assistance until she is more steady (even if she has to get up in the middle of the night to go to the bathroom).  She was lightheaded and very unsteady on her feet today with productive cough and decreased O2 sats on RA while she was walking (84%).  I would recommend another night to get her more steady on her feet (her primary caregiver is a 41 y.o. Grandson) and to work on her respiratory status.      Follow Up Recommendations Home health PT;Supervision for mobility/OOB (home O2, HHOT)    Equipment Recommendations  None recommended by PT    Recommendations for Other Services   NA    Precautions / Restrictions Precautions Precautions: Fall Precaution Comments: pt with h/o fall Required Braces or Orthoses: Sling Restrictions LUE Weight Bearing: Non weight bearing      Mobility  Bed Mobility Overal bed mobility: Needs Assistance Bed Mobility: Rolling;Sidelying to Sit Rolling: Min assist Sidelying to sit: Min assist       General bed mobility comments: Min assist to have pt roll to right side and support trunk to sit up from side lying from flat bed without bed rails.   Transfers Overall transfer level: Needs assistance Equipment used: 1 person hand held  assist Transfers: Sit to/from Stand Sit to Stand: Min assist         General transfer comment: Min hand held assist to support pt during transitions.    Ambulation/Gait Ambulation/Gait assistance: Min assist Ambulation Distance (Feet): 25 Feet Assistive device: 1 person hand held assist Gait Pattern/deviations: Step-through pattern;Staggering left;Staggering right Gait velocity: decreased Gait velocity interpretation: Below normal speed for age/gender General Gait Details: Pt with min hand held assist for balance during gait.  She had to both sit and stand for a minute before progressing as she is lighheaded and nauseated with transitions.           Balance Overall balance assessment: Needs assistance Sitting-balance support: Feet supported;Single extremity supported Sitting balance-Leahy Scale: Fair Sitting balance - Comments: supervision (+) sway and tendancy towards posterior LOB when first sitting up Postural control: Posterior lean Standing balance support: Single extremity supported Standing balance-Leahy Scale: Poor Standing balance comment: needs external assist for balance in standing.                              Pertinent Vitals/Pain Pain Assessment: 0-10 Pain Score: 5  Pain Location: left arm Pain Descriptors / Indicators: Aching;Burning Pain Intervention(s): Limited activity within patient's tolerance;Monitored during session;Repositioned    Home Living Family/patient expects to be discharged to:: Private residence Living Arrangements: Other relatives (grandson 16 y.o.  doesn't work) Available Help at Discharge: Family;Available 24 hours/day Type of Home: House Home Access: Level entry     Home Layout: One level Home Equipment: Cane - single point;Walker - 2 wheels;Other (comment) (not sure if the walker has wheels, )      Prior Function Level of Independence: Independent (pt furniture walks)               Hand Dominance    Dominant Hand: Right    Extremity/Trunk Assessment   Upper Extremity Assessment: Defer to OT evaluation           Lower Extremity Assessment: Generalized weakness      Cervical / Trunk Assessment: Normal  Communication   Communication: HOH  Cognition Arousal/Alertness: Awake/alert Behavior During Therapy: WFL for tasks assessed/performed Overall Cognitive Status: Within Functional Limits for tasks assessed                      General Comments General comments (skin integrity, edema, etc.): Pt with productive cough, h/o COPD, but not currently on home O2.  O2 sats with gait 84% on RA, unable to get into 90s with pursed lip breathing.  O2 re-applied at 2 L O2 [place] and O2 sats got back into low 90s.  RN made aware.     Exercises Other Exercises Other Exercises: incintive spirometer x 5 reps with cues for correct form.       Assessment/Plan    PT Assessment Patient needs continued PT services  PT Diagnosis Difficulty walking;Abnormality of gait;Generalized weakness;Acute pain   PT Problem List Decreased strength;Decreased activity tolerance;Decreased balance;Decreased mobility;Decreased knowledge of use of DME;Pain;Cardiopulmonary status limiting activity  PT Treatment Interventions DME instruction;Gait training;Functional mobility training;Therapeutic activities;Therapeutic exercise;Balance training;Neuromuscular re-education;Patient/family education;Modalities   PT Goals (Current goals can be found in the Care Plan section) Acute Rehab PT Goals Patient Stated Goal: to go home today PT Goal Formulation: With patient Time For Goal Achievement: 04/15/15 Potential to Achieve Goals: Good    Frequency Min 5X/week           End of Session Equipment Utilized During Treatment: Gait belt Activity Tolerance: Patient limited by pain;Other (comment) (limited by lightheadedness) Patient left: in chair;with call bell/phone within reach;with family/visitor present Nurse  Communication: Other (comment) (drop in O2 sats)         Time: 1610-96041152-1230 PT Time Calculation (min) (ACUTE ONLY): 38 min   Charges:   PT Evaluation $Initial PT Evaluation Tier I: 1 Procedure PT Treatments $Gait Training: 8-22 mins $Therapeutic Activity: 8-22 mins        Johana Hopkinson B. Cailah Reach, PT, DPT [phone]   04/08/2015, 12:38 PM

## 2015-04-08 NOTE — Care Management Note (Signed)
Case Management Note  Patient Details  Name: Terri Chen MRN: [id number] Date of Birth: 06-Dec-1934  Subjective/Objective:        S/p left shoulder ORIF            Action/Plan: PT and OT recommending home health. Spoke with patient about discharge plan. Patients states that her grandson lives with her and will be able to assist her 24/7. [facility] and set [facility] HH[facility] and HHOT. No equipment needs identified.  Expected Discharge Date:                  Expected Discharge Plan:  Home w Home Health Services  In-House Referral:  NA  Discharge planning Services  CM Consult  Post Acute Care Choice:    Choice offered to:     DME Arranged:    DME Agency:     HH Arranged:  PT, OT HH Agency:  [facility]  Status of Service:  Completed, signed off  Medicare Important Message Given:    Date Medicare IM Given:    Medicare IM give by:    Date Additional Medicare IM Given:    Additional Medicare Important Message give by:     If discussed at Long Length of Stay Meetings, dates discussed:    Additional Comments:  Monica BectonKrieg, Brynlynn Walko Watson, RN 04/08/2015, 3:09 PM

## 2015-04-08 NOTE — Progress Notes (Signed)
04/08/2015 SATURATION QUALIFICATIONS: (This note is used to comply with regulatory documentation for home oxygen)  Patient Saturations on Room Air at Rest = 88%  Patient Saturations on Room Air while Ambulating = 84%  Patient Saturations on 2 Liters of oxygen while Ambulating = 90% with cues for pursed lip breathing.  Please briefly explain why patient needs home oxygen: Pt desaturates on RA while moving around.   Rollene Rotundaebecca B. Gilbert Narain, PT, DPT [phone]

## 2015-04-08 NOTE — Anesthesia Postprocedure Evaluation (Signed)
Anesthesia Post Note  Patient: [name] Faye K Spooner  Procedure(s) Performed: Procedure(s) (LRB): OPEN REDUCTION INTERNAL FIXATION (ORIF) PROXIMAL HUMERUS FRACTURE (Left)  Patient location during evaluation: PACU Anesthesia Type: Regional and General Level of consciousness: awake and alert Pain management: pain level controlled Vital Signs Assessment: post-procedure vital signs reviewed and stable Respiratory status: spontaneous breathing Cardiovascular status: blood pressure returned to baseline Anesthetic complications: no    Last Vitals:  Filed Vitals:   04/08/15 0842 04/08/15 0952  BP: 129/58 142/63  Pulse:  72  Temp:  36.8 C  Resp:      Last Pain:  Filed Vitals:   04/08/15 1528  PainSc: 6                  Kennieth RadFitzgerald, Kainen Struckman E
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
## 2015-06-07 ENCOUNTER — Inpatient Hospital Stay (HOSPITAL_COMMUNITY)
Admission: EM | Admit: 2015-06-07 | Discharge: 2015-06-11 | DRG: 481 | Disposition: A | Discharge status: Skilled Nursing Facility | Payer: Medicare Other | Attending: Internal Medicine | Admitting: Internal Medicine

## 2015-06-07 ENCOUNTER — Emergency Department (HOSPITAL_COMMUNITY): Payer: Medicare Other

## 2015-06-07 ENCOUNTER — Encounter (HOSPITAL_COMMUNITY): Payer: Self-pay

## 2015-06-07 DIAGNOSIS — Z7982 Long term (current) use of aspirin: Secondary | ICD-10-CM

## 2015-06-07 DIAGNOSIS — S2232XA Fracture of one rib, left side, initial encounter for closed fracture: Secondary | ICD-10-CM | POA: Diagnosis present

## 2015-06-07 DIAGNOSIS — E119 Type 2 diabetes mellitus without complications: Secondary | ICD-10-CM

## 2015-06-07 DIAGNOSIS — S72002A Fracture of unspecified part of neck of left femur, initial encounter for closed fracture: Secondary | ICD-10-CM | POA: Diagnosis present

## 2015-06-07 DIAGNOSIS — Z961 Presence of intraocular lens: Secondary | ICD-10-CM | POA: Diagnosis present

## 2015-06-07 DIAGNOSIS — Z9842 Cataract extraction status, left eye: Secondary | ICD-10-CM

## 2015-06-07 DIAGNOSIS — S72012A Unspecified intracapsular fracture of left femur, initial encounter for closed fracture: Principal | ICD-10-CM | POA: Diagnosis present

## 2015-06-07 DIAGNOSIS — Z9181 History of falling: Secondary | ICD-10-CM

## 2015-06-07 DIAGNOSIS — F419 Anxiety disorder, unspecified: Secondary | ICD-10-CM | POA: Diagnosis present

## 2015-06-07 DIAGNOSIS — J449 Chronic obstructive pulmonary disease, unspecified: Secondary | ICD-10-CM | POA: Diagnosis present

## 2015-06-07 DIAGNOSIS — S72009A Fracture of unspecified part of neck of unspecified femur, initial encounter for closed fracture: Secondary | ICD-10-CM | POA: Diagnosis present

## 2015-06-07 DIAGNOSIS — Z9841 Cataract extraction status, right eye: Secondary | ICD-10-CM

## 2015-06-07 DIAGNOSIS — I10 Essential (primary) hypertension: Secondary | ICD-10-CM | POA: Diagnosis present

## 2015-06-07 DIAGNOSIS — R296 Repeated falls: Secondary | ICD-10-CM | POA: Diagnosis present

## 2015-06-07 DIAGNOSIS — E876 Hypokalemia: Secondary | ICD-10-CM | POA: Diagnosis present

## 2015-06-07 DIAGNOSIS — E7800 Pure hypercholesterolemia, unspecified: Secondary | ICD-10-CM | POA: Diagnosis present

## 2015-06-07 DIAGNOSIS — Z419 Encounter for procedure for purposes other than remedying health state, unspecified: Secondary | ICD-10-CM

## 2015-06-07 DIAGNOSIS — R531 Weakness: Secondary | ICD-10-CM | POA: Diagnosis not present

## 2015-06-07 DIAGNOSIS — F17210 Nicotine dependence, cigarettes, uncomplicated: Secondary | ICD-10-CM | POA: Diagnosis present

## 2015-06-07 DIAGNOSIS — S42209A Unspecified fracture of upper end of unspecified humerus, initial encounter for closed fracture: Secondary | ICD-10-CM | POA: Diagnosis present

## 2015-06-07 DIAGNOSIS — E785 Hyperlipidemia, unspecified: Secondary | ICD-10-CM | POA: Diagnosis present

## 2015-06-07 LAB — CBC WITH DIFFERENTIAL/PLATELET
BASOS PCT: 0 %
Basophils Absolute: 0 10*3/uL (ref 0.0–0.1)
EOS ABS: 0 10*3/uL (ref 0.0–0.7)
Eosinophils Relative: 0 %
HCT: 42.3 % (ref 36.0–46.0)
Hemoglobin: 14.3 g/dL (ref 12.0–15.0)
LYMPHS ABS: 1.5 10*3/uL (ref 0.7–4.0)
Lymphocytes Relative: 9 %
MCH: 31.3 pg (ref 26.0–34.0)
MCHC: 33.8 g/dL (ref 30.0–36.0)
MCV: 92.6 fL (ref 78.0–100.0)
Monocytes Absolute: 0.8 10*3/uL (ref 0.1–1.0)
Monocytes Relative: 5 %
Neutro Abs: 13.4 10*3/uL — ABNORMAL HIGH (ref 1.7–7.7)
Neutrophils Relative %: 86 %
Platelets: 264 10*3/uL (ref 150–400)
RBC: 4.57 MIL/uL (ref 3.87–5.11)
RDW: 13.4 % (ref 11.5–15.5)
WBC: 15.7 10*3/uL — AB (ref 4.0–10.5)

## 2015-06-07 LAB — PROTIME-INR
INR: 1.02 (ref 0.00–1.49)
PROTHROMBIN TIME: 13.6 s (ref 11.6–15.2)

## 2015-06-07 NOTE — ED Notes (Signed)
Patient states she has fell 4 times within the past week. States this evening she fell while getting out of chair. Complains of pain to left elbow and left hip. Denies dizziness. Alert and oriented x4.

## 2015-06-07 NOTE — ED Provider Notes (Signed)
CSN: [id number]     Arrival date & time 06/07/15  2147 History  By signing my name below, I, Budd Palmer, attest that this documentation has been prepared under the direction and in the presence of Shon Baton, MD. Electronically Signed: Budd Palmer, ED Scribe. 06/07/2015. 11:35 PM.    Chief Complaint  Patient presents with  . Fall   The history is provided by the patient. No language interpreter was used.   HPI Comments: Terri Chen is a 81 y.o. female smoker at 0.5 ppd with a PMHx of DM, HTN, COPD, HLD, and high cholesterol who presents to the Emergency Department complaining of a fall that occurred this evening. Pt states "I just fell." She reports associated weakness in the legs and notes she has fallen 4 times in the last week. She also endorses left hip pain, currently rated as an 8/10, as well as left elbow pain. She notes she probably struck her head and was not able to walk after her most recent fall. She is not on any blood thinners. She has NKDA and is not on any daily medication. She notes her orthopedic surgeon is Dr Ophelia Charter. Pt denies dizziness.    Past Medical History  Diagnosis Date  . Diabetes mellitus   . Diabetes mellitus without complication (HCC)   . Hypertension   . High cholesterol   . COPD (chronic obstructive pulmonary disease) (HCC)   . Hyperlipidemia   . Hypertension   . 1979     left side weakness  . Pneumonia   . Anxiety    Past Surgical History  Procedure Laterality Date  . Lung surgery    . Knee surgery Right     arthroscopic  . Lung surgery    . Abdominal hysterectomy    . Eye surgery Bilateral     cataract surgery with lens implant  . Tonsillectomy    . Colonoscopy    . Orif humerus fracture Left 04/07/2015  . Orif humerus fracture Left 04/07/2015    Procedure: OPEN REDUCTION INTERNAL FIXATION (ORIF) PROXIMAL HUMERUS FRACTURE;  Surgeon: Eldred Manges, MD;  Location: [facility] OR;  Service: Orthopedics;  Laterality: Left;   Family  History  Problem Relation Age of Onset  . Diabetes Mother   . COPD Mother   . Heart attack Father    Social History  Substance Use Topics  . Smoking status: Current Every Day Smoker -- 0.50 packs/day for 60 years    Types: Cigarettes  . Smokeless tobacco: Never Used  . Alcohol Use: No   OB History    Gravida Para Term Preterm AB TAB SAB Ectopic Multiple Living   0 0 0 0 0 0 0 0       Review of Systems  Constitutional: Negative for fever.  Respiratory: Negative for shortness of breath.   Cardiovascular: Negative for chest pain.  Musculoskeletal: Positive for arthralgias and gait problem.  Neurological: Negative for dizziness.  All other systems reviewed and are negative.   Allergies  Review of patient's allergies indicates no known allergies.  Home Medications   Prior to Admission medications   Medication Sig Start Date End Date Taking? Authorizing Provider  amLODipine (NORVASC) 5 MG tablet Take 5 mg by mouth daily with breakfast.     Historical Provider, MD  aspirin EC 81 MG tablet Take 81 mg by mouth daily with breakfast.     Historical Provider, MD  budesonide-formoterol (SYMBICORT) 160-4.5 MCG/ACT inhaler Inhale 2 puffs into the  lungs 2 (two) times daily.    Historical Provider, MD  hydrochlorothiazide (HYDRODIURIL) 25 MG tablet Take 25 mg by mouth daily with breakfast.     Historical Provider, MD  metoprolol (LOPRESSOR) 100 MG tablet Take 100 mg by mouth daily with breakfast.  07/23/13   Historical Provider, MD  oxyCODONE-acetaminophen (ROXICET) 5-325 MG tablet 1 to 2 tablets every 4 to 6 hrs as needed for pain. 04/08/15   Eldred Manges, MD  pravastatin (PRAVACHOL) 40 MG tablet Take 40 mg by mouth daily after supper.     Historical Provider, MD   BP 136/70 mmHg  Pulse 92  Temp(Src) 97.5 F (36.4 C) (Oral)  Resp 22  Ht 5\' 5"  (1.651 m)  Wt 145 lb (65.772 kg)  BMI 24.13 kg/m2  SpO2 96% Physical Exam  Constitutional: She is oriented to person, place, and time. No  distress.  Chronically ill-appearing, no acute distress  HENT:  Head: Normocephalic and atraumatic.  Cardiovascular: Normal rate, regular rhythm and normal heart sounds.   No murmur heard. Pulmonary/Chest: Effort normal and breath sounds normal. No respiratory distress. She has no wheezes.  Abdominal: Soft. Bowel sounds are normal. There is no tenderness. There is no rebound.  Musculoskeletal:  No obvious foreshortening of the legs, patient does have pain with range of motion of the left hip, there is tenderness to palpation, abrasions noted over the left elbow with associated soft tissue swelling  Neurological: She is alert and oriented to person, place, and time.  Cranial nerves II through XII intact, 5 out of 5 strength in all extremities with the exception of the left lower extremity which is difficult to examine secondary to pain  Skin: Skin is warm and dry.  Psychiatric: She has a normal mood and affect.  Nursing note and vitals reviewed.   ED Course  Procedures  DIAGNOSTIC STUDIES: Oxygen Saturation is 96% on RA, adequate by my interpretation.    COORDINATION OF CARE: 11:25 PM - Discussed XR results of hip fracture and plans to consult with the orthopedic surgeon. Pt advised of plan for treatment and pt agrees.  Labs Review Labs Reviewed  BASIC METABOLIC PANEL - Abnormal; Notable for the following:    Potassium 3.1 (*)    Glucose, Bld 188 (*)    Creatinine, Ser 1.30 (*)    GFR calc non Af Amer 38 (*)    GFR calc Af Amer 44 (*)    All other components within normal limits  CBC WITH DIFFERENTIAL/PLATELET - Abnormal; Notable for the following:    WBC 15.7 (*)    Neutro Abs 13.4 (*)    All other components within normal limits  PROTIME-INR  URINALYSIS, ROUTINE W REFLEX MICROSCOPIC (NOT AT [facility])  TYPE AND SCREEN    Imaging Review Dg Chest 1 View  06/07/2015  CLINICAL DATA:  Pt states she has fallen 4 times in the last few days. Pt states she fell tonight getting out of  her chair and has had Lt hip pain and Lt elbow pain since. Abrasions to posterior Lt elbow. Hx diabetes, HTN, COPD, ORIF LEFT HUMERUS FRACTURE 04/07/2015, smoker EXAM: CHEST 1 VIEW COMPARISON:  None. FINDINGS: There is a fracture of the lateral left fifth rib, mildly displaced approximately 1/2 shaft width. There is subcutaneous air in chest lateral and inferior to this, but no evidence of a pneumothorax. No other convincing fracture. Lungs are mildly hyperexpanded. There is mild linear scarring or atelectasis in the medial left lung base and minor  upper lobe scarring. No evidence of pneumonia or pulmonary edema. No pleural effusion. Cardiac silhouette is normal in size. No mediastinal or hilar masses. IMPRESSION: 1. Mildly displaced fracture of the left lateral fifth rib. Although there is a small amount of adjacent subcutaneous air, there is no evidence of a pneumothorax. 2. No other acute findings. Electronically Signed   By: Amie Portland M.D.   On: 06/07/2015 23:11   Dg Elbow Complete Left  06/07/2015  CLINICAL DATA:  Status post fall 4 times, with left elbow pain. Initial encounter. EXAM: LEFT ELBOW - COMPLETE 3+ VIEW COMPARISON:  None. FINDINGS: There is no evidence of fracture or dislocation. There appears to be some degree of chronic deformity of the distal humerus. The visualized joint spaces are preserved. No significant joint effusion is identified. The soft tissues are unremarkable in appearance. IMPRESSION: No evidence of acute fracture or dislocation. Underlying apparent chronic deformity of the distal humerus. Electronically Signed   By: Roanna Raider M.D.   On: 06/07/2015 22:50   Ct Head Wo Contrast  06/07/2015  CLINICAL DATA:  Multiple recent falls, with generalized weakness. Initial encounter. EXAM: CT HEAD WITHOUT CONTRAST TECHNIQUE: Contiguous axial images were obtained from the base of the skull through the vertex without intravenous contrast. COMPARISON:  None. FINDINGS: There is no  evidence of acute infarction, mass lesion, or intra- or extra-axial hemorrhage on CT. Prominence of ventricles and sulci reflects mild cortical volume loss. Scattered periventricular and subcortical white matter change likely reflects small vessel ischemic microangiopathy. Cerebellar atrophy is noted. The brainstem and fourth ventricle are within normal limits. The basal ganglia are unremarkable in appearance. The cerebral hemispheres demonstrate grossly normal gray-white differentiation. No mass effect or midline shift is seen. There is no evidence of fracture; visualized osseous structures are unremarkable in appearance. The visualized portions of the orbits are within normal limits. The paranasal sinuses and mastoid air cells are well-aerated. No significant soft tissue abnormalities are seen. IMPRESSION: 1. No acute intracranial pathology seen on CT. 2. Mild cortical volume loss and scattered small vessel ischemic microangiopathy. Electronically Signed   By: Roanna Raider M.D.   On: 06/07/2015 23:59   Dg Hip Unilat With Pelvis 2-3 Views Left  06/07/2015  CLINICAL DATA:  Status post falls, with left hip pain. Initial encounter. EXAM: DG HIP (WITH OR WITHOUT PELVIS) 2-3V LEFT COMPARISON:  None. FINDINGS: There is a minimally displaced subcapital fracture of the left femoral neck. The left femoral head remains seated at the acetabulum. The right hip joint is unremarkable. No significant degenerative change is appreciated. The sacroiliac joints are unremarkable in appearance. The visualized bowel gas pattern is grossly unremarkable in appearance. Scattered phleboliths are noted within the pelvis. Diffuse vascular calcifications are seen. IMPRESSION: 1. Minimally displaced subcapital fracture of the left femoral neck. 2. Diffuse vascular calcifications seen. Electronically Signed   By: Roanna Raider M.D.   On: 06/07/2015 22:46   I have personally reviewed and evaluated these images and lab results as part of  my medical decision-making.   EKG Interpretation   Date/Time:  Tuesday June 07 2015 23:45:49 EST Ventricular Rate:  95 PR Interval:  130 QRS Duration: 71 QT Interval:  351 QTC Calculation: 441 R Axis:   74 Text Interpretation:  Sinus rhythm Abnormal T, consider ischemia, diffuse  leads Similar to prior Confirmed by HORTON  MD, COURTNEY ([id number]) on  06/08/2015 12:30:59 AM      MDM   Final diagnoses:  Recurrent falls  Closed  left hip fracture, initial encounter [facility])   Patient presents with recurrent falls.  Complaining of left hip and elbow pain. Falls are not described as mechanical. She appears to be following frequently recently. She is nontoxic and nonfocal. She has pain over her left hip and with range of motion. Plain films obtained and show a left femoral neck fracture. Basic labwork obtained and largely at the patient's baseline. EKG is unchanged. Urinalysis is pending. CT head without traumatic injury. Patient orthopedist is Dr. Ophelia Charter with [facility] orthopedics. Discussed the patient with Dr. Otelia Sergeant, who is on call.  Requesting hospitalist admission and transfer to come in for definitive management.  I personally performed the services described in this documentation, which was scribed in my presence. The recorded information has been reviewed and is accurate.   Shon Baton, MD 06/08/15 [phone]

## 2015-06-08 ENCOUNTER — Encounter (HOSPITAL_COMMUNITY)
Admission: EM | Disposition: A | Discharge status: Skilled Nursing Facility | Payer: Self-pay | Source: Home / Self Care | Attending: Internal Medicine

## 2015-06-08 ENCOUNTER — Inpatient Hospital Stay (HOSPITAL_COMMUNITY): Payer: Medicare Other | Admitting: Anesthesiology

## 2015-06-08 ENCOUNTER — Encounter (HOSPITAL_COMMUNITY): Payer: Self-pay | Admitting: Internal Medicine

## 2015-06-08 ENCOUNTER — Inpatient Hospital Stay (HOSPITAL_COMMUNITY): Payer: Medicare Other

## 2015-06-08 DIAGNOSIS — E119 Type 2 diabetes mellitus without complications: Secondary | ICD-10-CM | POA: Diagnosis present

## 2015-06-08 DIAGNOSIS — Z9841 Cataract extraction status, right eye: Secondary | ICD-10-CM | POA: Diagnosis not present

## 2015-06-08 DIAGNOSIS — S72009A Fracture of unspecified part of neck of unspecified femur, initial encounter for closed fracture: Secondary | ICD-10-CM | POA: Diagnosis present

## 2015-06-08 DIAGNOSIS — Z9181 History of falling: Secondary | ICD-10-CM | POA: Diagnosis not present

## 2015-06-08 DIAGNOSIS — E785 Hyperlipidemia, unspecified: Secondary | ICD-10-CM

## 2015-06-08 DIAGNOSIS — S72002A Fracture of unspecified part of neck of left femur, initial encounter for closed fracture: Secondary | ICD-10-CM | POA: Diagnosis not present

## 2015-06-08 DIAGNOSIS — J449 Chronic obstructive pulmonary disease, unspecified: Secondary | ICD-10-CM | POA: Diagnosis present

## 2015-06-08 DIAGNOSIS — F17210 Nicotine dependence, cigarettes, uncomplicated: Secondary | ICD-10-CM | POA: Diagnosis not present

## 2015-06-08 DIAGNOSIS — Z961 Presence of intraocular lens: Secondary | ICD-10-CM | POA: Diagnosis not present

## 2015-06-08 DIAGNOSIS — I10 Essential (primary) hypertension: Secondary | ICD-10-CM | POA: Diagnosis not present

## 2015-06-08 DIAGNOSIS — R296 Repeated falls: Secondary | ICD-10-CM | POA: Diagnosis not present

## 2015-06-08 DIAGNOSIS — E7800 Pure hypercholesterolemia, unspecified: Secondary | ICD-10-CM | POA: Diagnosis not present

## 2015-06-08 DIAGNOSIS — Z7982 Long term (current) use of aspirin: Secondary | ICD-10-CM | POA: Diagnosis not present

## 2015-06-08 DIAGNOSIS — E876 Hypokalemia: Secondary | ICD-10-CM | POA: Diagnosis not present

## 2015-06-08 DIAGNOSIS — Z9842 Cataract extraction status, left eye: Secondary | ICD-10-CM | POA: Diagnosis not present

## 2015-06-08 DIAGNOSIS — R531 Weakness: Secondary | ICD-10-CM | POA: Diagnosis present

## 2015-06-08 DIAGNOSIS — S72012A Unspecified intracapsular fracture of left femur, initial encounter for closed fracture: Secondary | ICD-10-CM | POA: Diagnosis present

## 2015-06-08 DIAGNOSIS — F419 Anxiety disorder, unspecified: Secondary | ICD-10-CM | POA: Diagnosis not present

## 2015-06-08 DIAGNOSIS — S2232XA Fracture of one rib, left side, initial encounter for closed fracture: Secondary | ICD-10-CM | POA: Diagnosis present

## 2015-06-08 HISTORY — PX: HIP PINNING,CANNULATED: SHX1758

## 2015-06-08 LAB — BASIC METABOLIC PANEL
Anion gap: 11 (ref 5–15)
BUN: 12 mg/dL (ref 6–20)
CALCIUM: 9.2 mg/dL (ref 8.9–10.3)
CO2: 27 mmol/L (ref 22–32)
CREATININE: 1.3 mg/dL — AB (ref 0.44–1.00)
Chloride: 102 mmol/L (ref 101–111)
GFR calc Af Amer: 44 mL/min — ABNORMAL LOW (ref >60)
GFR, EST NON AFRICAN AMERICAN: 38 mL/min — AB (ref >60)
Glucose, Bld: 188 mg/dL — ABNORMAL HIGH (ref 65–99)
Potassium: 3.1 mmol/L — ABNORMAL LOW (ref 3.5–5.1)
SODIUM: 140 mmol/L (ref 135–145)

## 2015-06-08 LAB — CBC
HCT: 42.3 % (ref 36.0–46.0)
HEMATOCRIT: 39.3 % (ref 36.0–46.0)
HEMOGLOBIN: 12.9 g/dL (ref 12.0–15.0)
Hemoglobin: 14 g/dL (ref 12.0–15.0)
MCH: 30.4 pg (ref 26.0–34.0)
MCH: 30.4 pg (ref 26.0–34.0)
MCHC: 33.1 g/dL (ref 30.0–36.0)
MCHC: 32.8 g/dL (ref 30.0–36.0)
MCV: 91.8 fL (ref 78.0–100.0)
MCV: 92.5 fL (ref 78.0–100.0)
PLATELETS: 254 10*3/uL (ref 150–400)
Platelets: 231 10*3/uL (ref 150–400)
RBC: 4.61 MIL/uL (ref 3.87–5.11)
RBC: 4.25 MIL/uL (ref 3.87–5.11)
RDW: 13.3 % (ref 11.5–15.5)
RDW: 13.4 % (ref 11.5–15.5)
WBC: 11.7 10*3/uL — ABNORMAL HIGH (ref 4.0–10.5)
WBC: 11.2 10*3/uL — ABNORMAL HIGH (ref 4.0–10.5)

## 2015-06-08 LAB — CREATININE, SERUM
Creatinine, Ser: 1.2 mg/dL — ABNORMAL HIGH (ref 0.44–1.00)
Creatinine, Ser: 1.21 mg/dL — ABNORMAL HIGH (ref 0.44–1.00)
GFR calc Af Amer: 49 mL/min — ABNORMAL LOW (ref >60)
GFR calc non Af Amer: 42 mL/min — ABNORMAL LOW (ref >60)
GFR, EST AFRICAN AMERICAN: 48 mL/min — AB (ref >60)
GFR, EST NON AFRICAN AMERICAN: 42 mL/min — AB (ref >60)

## 2015-06-08 LAB — GLUCOSE, CAPILLARY
GLUCOSE-CAPILLARY: 141 mg/dL — AB (ref 65–99)
GLUCOSE-CAPILLARY: 112 mg/dL — AB (ref 65–99)
Glucose-Capillary: 131 mg/dL — ABNORMAL HIGH (ref 65–99)
Glucose-Capillary: 108 mg/dL — ABNORMAL HIGH (ref 65–99)
Glucose-Capillary: 108 mg/dL — ABNORMAL HIGH (ref 65–99)
Glucose-Capillary: 239 mg/dL — ABNORMAL HIGH (ref 65–99)

## 2015-06-08 LAB — CBG MONITORING, ED: GLUCOSE-CAPILLARY: 165 mg/dL — AB (ref 65–99)

## 2015-06-08 SURGERY — FIXATION, FEMUR, NECK, PERCUTANEOUS, USING SCREW
Anesthesia: General | Laterality: Left

## 2015-06-08 MED ORDER — METOCLOPRAMIDE HCL 5 MG/ML IJ SOLN: 5.0000 mg | Freq: Three times a day (TID) | INTRAMUSCULAR | Status: DC | PRN

## 2015-06-08 MED ORDER — HEPARIN SODIUM (PORCINE) 5000 UNIT/ML IJ SOLN: 5000.0000 [IU] | Freq: Three times a day (TID) | INTRAMUSCULAR | Status: DC

## 2015-06-08 MED ORDER — METOCLOPRAMIDE HCL 5 MG PO TABS: 5.0000 mg | ORAL_TABLET | Freq: Three times a day (TID) | ORAL | Status: DC | PRN

## 2015-06-08 MED ORDER — SUGAMMADEX SODIUM 200 MG/2ML IV SOLN
INTRAVENOUS | Status: AC
  Filled 2015-06-08: qty 2

## 2015-06-08 MED ORDER — METOPROLOL TARTRATE 100 MG PO TABS
100.0000 mg | ORAL_TABLET | Freq: Every day | ORAL | Status: DC
  Administered 2015-06-08 – 2015-06-11 (×4): 100 mg via ORAL
  Filled 2015-06-08 (×4): qty 1

## 2015-06-08 MED ORDER — FENTANYL CITRATE (PF) 250 MCG/5ML IJ SOLN
INTRAMUSCULAR | Status: AC
  Filled 2015-06-08: qty 5

## 2015-06-08 MED ORDER — ARTIFICIAL TEARS OP OINT
TOPICAL_OINTMENT | OPHTHALMIC | Status: AC
  Filled 2015-06-08: qty 3.5

## 2015-06-08 MED ORDER — PROPOFOL 10 MG/ML IV BOLUS
INTRAVENOUS | Status: DC | PRN
  Administered 2015-06-08: 80 mg via INTRAVENOUS

## 2015-06-08 MED ORDER — GLYCOPYRROLATE 0.2 MG/ML IJ SOLN
INTRAMUSCULAR | Status: AC
  Filled 2015-06-08: qty 1

## 2015-06-08 MED ORDER — 0.9 % SODIUM CHLORIDE (POUR BTL) OPTIME
TOPICAL | Status: DC | PRN
  Administered 2015-06-08: 1000 mL

## 2015-06-08 MED ORDER — ROCURONIUM BROMIDE 50 MG/5ML IV SOLN
INTRAVENOUS | Status: AC
  Filled 2015-06-08: qty 2

## 2015-06-08 MED ORDER — SODIUM CHLORIDE 0.9 % IJ SOLN
INTRAMUSCULAR | Status: AC
  Filled 2015-06-08: qty 10

## 2015-06-08 MED ORDER — OXYCODONE HCL 5 MG/5ML PO SOLN: 5.0000 mg | Freq: Once | ORAL | Status: DC | PRN

## 2015-06-08 MED ORDER — ONDANSETRON HCL 4 MG/2ML IJ SOLN
INTRAMUSCULAR | Status: AC
  Filled 2015-06-08: qty 2

## 2015-06-08 MED ORDER — ONDANSETRON HCL 4 MG/2ML IJ SOLN: 4.0000 mg | Freq: Once | INTRAMUSCULAR | Status: DC | PRN

## 2015-06-08 MED ORDER — SUGAMMADEX SODIUM 200 MG/2ML IV SOLN
INTRAVENOUS | Status: DC | PRN
  Administered 2015-06-08: 150 mg via INTRAVENOUS

## 2015-06-08 MED ORDER — PHENYLEPHRINE HCL 10 MG/ML IJ SOLN
10.0000 mg | INTRAMUSCULAR | Status: DC | PRN
  Administered 2015-06-08: 20 ug/min via INTRAVENOUS

## 2015-06-08 MED ORDER — LACTATED RINGERS IV SOLN
INTRAVENOUS | Status: DC
  Administered 2015-06-08: 15:00:00 via INTRAVENOUS

## 2015-06-08 MED ORDER — AMLODIPINE BESYLATE 5 MG PO TABS
5.0000 mg | ORAL_TABLET | Freq: Every day | ORAL | Status: DC
  Administered 2015-06-08 – 2015-06-11 (×4): 5 mg via ORAL
  Filled 2015-06-08 (×4): qty 1

## 2015-06-08 MED ORDER — PRAVASTATIN SODIUM 40 MG PO TABS
40.0000 mg | ORAL_TABLET | Freq: Every day | ORAL | Status: DC
  Administered 2015-06-09 – 2015-06-10 (×2): 40 mg via ORAL
  Filled 2015-06-08 (×2): qty 1

## 2015-06-08 MED ORDER — FENTANYL CITRATE (PF) 100 MCG/2ML IJ SOLN
50.0000 ug | Freq: Once | INTRAMUSCULAR | Status: AC
  Administered 2015-06-08: 50 ug via INTRAVENOUS
  Filled 2015-06-08: qty 2

## 2015-06-08 MED ORDER — HYDROMORPHONE HCL 1 MG/ML IJ SOLN: 0.2500 mg | INTRAMUSCULAR | Status: DC | PRN

## 2015-06-08 MED ORDER — HYDROCODONE-ACETAMINOPHEN 5-325 MG PO TABS
1.0000 | ORAL_TABLET | Freq: Four times a day (QID) | ORAL | Status: DC | PRN
  Administered 2015-06-09 – 2015-06-11 (×8): 2 via ORAL
  Filled 2015-06-08 (×9): qty 2

## 2015-06-08 MED ORDER — BUPIVACAINE HCL (PF) 0.25 % IJ SOLN
INTRAMUSCULAR | Status: AC
  Filled 2015-06-08: qty 30

## 2015-06-08 MED ORDER — LIDOCAINE HCL (CARDIAC) 20 MG/ML IV SOLN
INTRAVENOUS | Status: DC | PRN
  Administered 2015-06-08 (×3): 50 mg via INTRAVENOUS

## 2015-06-08 MED ORDER — ACETAMINOPHEN 650 MG RE SUPP: 650.0000 mg | Freq: Four times a day (QID) | RECTAL | Status: DC | PRN

## 2015-06-08 MED ORDER — MORPHINE SULFATE (PF) 2 MG/ML IV SOLN: 1.0000 mg | INTRAVENOUS | Status: DC | PRN

## 2015-06-08 MED ORDER — CEFAZOLIN SODIUM-DEXTROSE 2-3 GM-% IV SOLR
INTRAVENOUS | Status: DC | PRN
Start: 2015-06-08 — End: 2015-06-08
  Administered 2015-06-08: 2 g via INTRAVENOUS

## 2015-06-08 MED ORDER — EPHEDRINE SULFATE 50 MG/ML IJ SOLN
INTRAMUSCULAR | Status: AC
  Filled 2015-06-08: qty 1

## 2015-06-08 MED ORDER — ASPIRIN EC 81 MG PO TBEC
81.0000 mg | DELAYED_RELEASE_TABLET | Freq: Every day | ORAL | Status: DC
  Administered 2015-06-08 – 2015-06-11 (×4): 81 mg via ORAL
  Filled 2015-06-08 (×4): qty 1

## 2015-06-08 MED ORDER — MORPHINE SULFATE (PF) 2 MG/ML IV SOLN
2.0000 mg | INTRAVENOUS | Status: DC | PRN
  Administered 2015-06-08: 2 mg via INTRAVENOUS
  Filled 2015-06-08: qty 1

## 2015-06-08 MED ORDER — BUPIVACAINE HCL 0.25 % IJ SOLN
INTRAMUSCULAR | Status: DC | PRN
  Administered 2015-06-08: 7 mL

## 2015-06-08 MED ORDER — PHENOL 1.4 % MT LIQD: 1.0000 | OROMUCOSAL | Status: DC | PRN

## 2015-06-08 MED ORDER — MENTHOL 3 MG MT LOZG: 1.0000 | LOZENGE | OROMUCOSAL | Status: DC | PRN

## 2015-06-08 MED ORDER — MOMETASONE FURO-FORMOTEROL FUM 200-5 MCG/ACT IN AERO
2.0000 | INHALATION_SPRAY | Freq: Two times a day (BID) | RESPIRATORY_TRACT | Status: DC
  Filled 2015-06-08 (×2): qty 8.8

## 2015-06-08 MED ORDER — ACETAMINOPHEN 325 MG PO TABS
650.0000 mg | ORAL_TABLET | Freq: Four times a day (QID) | ORAL | Status: DC | PRN
  Administered 2015-06-11: 650 mg via ORAL
  Filled 2015-06-08: qty 2

## 2015-06-08 MED ORDER — SUGAMMADEX SODIUM 500 MG/5ML IV SOLN
INTRAVENOUS | Status: DC | PRN
  Administered 2015-06-08: 150 mg via INTRAVENOUS

## 2015-06-08 MED ORDER — OXYCODONE-ACETAMINOPHEN 5-325 MG PO TABS
2.0000 | ORAL_TABLET | ORAL | Status: DC | PRN
  Administered 2015-06-08 (×2): 2 via ORAL
  Filled 2015-06-08 (×2): qty 2

## 2015-06-08 MED ORDER — ONDANSETRON HCL 4 MG/2ML IJ SOLN
4.0000 mg | Freq: Four times a day (QID) | INTRAMUSCULAR | Status: DC | PRN
  Administered 2015-06-09: 4 mg via INTRAVENOUS
  Filled 2015-06-08: qty 2

## 2015-06-08 MED ORDER — OXYCODONE HCL 5 MG PO TABS: 5.0000 mg | ORAL_TABLET | Freq: Once | ORAL | Status: DC | PRN

## 2015-06-08 MED ORDER — ENOXAPARIN SODIUM 40 MG/0.4ML ~~LOC~~ SOLN
40.0000 mg | SUBCUTANEOUS | Status: DC
  Administered 2015-06-09 – 2015-06-11 (×3): 40 mg via SUBCUTANEOUS
  Filled 2015-06-08 (×2): qty 0.4

## 2015-06-08 MED ORDER — ROCURONIUM BROMIDE 100 MG/10ML IV SOLN
INTRAVENOUS | Status: DC | PRN
  Administered 2015-06-08: 40 mg via INTRAVENOUS

## 2015-06-08 MED ORDER — INSULIN ASPART 100 UNIT/ML ~~LOC~~ SOLN
0.0000 [IU] | Freq: Three times a day (TID) | SUBCUTANEOUS | Status: DC
  Administered 2015-06-09 (×3): 1 [IU] via SUBCUTANEOUS
  Administered 2015-06-10: 2 [IU] via SUBCUTANEOUS

## 2015-06-08 MED ORDER — DOCUSATE SODIUM 100 MG PO CAPS
100.0000 mg | ORAL_CAPSULE | Freq: Two times a day (BID) | ORAL | Status: DC
  Administered 2015-06-08 – 2015-06-11 (×6): 100 mg via ORAL
  Filled 2015-06-08 (×6): qty 1

## 2015-06-08 MED ORDER — ONDANSETRON HCL 4 MG PO TABS: 4.0000 mg | ORAL_TABLET | Freq: Four times a day (QID) | ORAL | Status: DC | PRN

## 2015-06-08 MED ORDER — LIDOCAINE HCL (CARDIAC) 20 MG/ML IV SOLN
INTRAVENOUS | Status: AC
  Filled 2015-06-08: qty 5

## 2015-06-08 MED ORDER — BISACODYL 10 MG RE SUPP: 10.0000 mg | Freq: Every day | RECTAL | Status: DC | PRN

## 2015-06-08 MED ORDER — EPHEDRINE SULFATE 50 MG/ML IJ SOLN
INTRAMUSCULAR | Status: DC | PRN
  Administered 2015-06-08 (×2): 10 mg via INTRAVENOUS

## 2015-06-08 MED ORDER — ONDANSETRON HCL 4 MG/2ML IJ SOLN: 4.0000 mg | Freq: Four times a day (QID) | INTRAMUSCULAR | Status: DC | PRN

## 2015-06-08 MED ORDER — INSULIN ASPART 100 UNIT/ML ~~LOC~~ SOLN
0.0000 [IU] | SUBCUTANEOUS | Status: DC
  Administered 2015-06-08: 1 [IU] via SUBCUTANEOUS
  Administered 2015-06-08: 2 [IU] via SUBCUTANEOUS
  Filled 2015-06-08: qty 1

## 2015-06-08 MED ORDER — SODIUM CHLORIDE 0.9 % IV SOLN
Freq: Once | INTRAVENOUS | Status: AC
  Administered 2015-06-08: 01:00:00 via INTRAVENOUS

## 2015-06-08 MED ORDER — DEXTROSE-NACL 5-0.9 % IV SOLN
INTRAVENOUS | Status: DC
  Administered 2015-06-08: 04:00:00 via INTRAVENOUS

## 2015-06-08 MED ORDER — ALBUMIN HUMAN 5 % IV SOLN
INTRAVENOUS | Status: DC | PRN
  Administered 2015-06-08: 16:00:00 via INTRAVENOUS

## 2015-06-08 MED ORDER — POTASSIUM CHLORIDE 10 MEQ/100ML IV SOLN
10.0000 meq | INTRAVENOUS | Status: AC
  Administered 2015-06-08 (×4): 10 meq via INTRAVENOUS
  Filled 2015-06-08 (×5): qty 100

## 2015-06-08 SURGICAL SUPPLY — 33 items
COVER PERINEAL POST (MISCELLANEOUS) ×3 IMPLANT
COVER SURGICAL LIGHT HANDLE (MISCELLANEOUS) ×3 IMPLANT
DRAPE STERI IOBAN 125X83 (DRAPES) ×3 IMPLANT
DRSG MEPILEX BORDER 4X4 (GAUZE/BANDAGES/DRESSINGS) ×3 IMPLANT
DURAPREP 26ML APPLICATOR (WOUND CARE) ×3 IMPLANT
ELECT REM PT RETURN 9FT ADLT (ELECTROSURGICAL) ×3
ELECTRODE REM PT RTRN 9FT ADLT (ELECTROSURGICAL) ×1 IMPLANT
GLOVE BIOGEL PI IND STRL 8 (GLOVE) ×2 IMPLANT
GLOVE BIOGEL PI INDICATOR 8 (GLOVE) ×4
GLOVE ORTHO TXT STRL SZ7.5 (GLOVE) ×6 IMPLANT
GOWN STRL REUS W/ TWL LRG LVL3 (GOWN DISPOSABLE) ×2 IMPLANT
GOWN STRL REUS W/TWL 2XL LVL3 (GOWN DISPOSABLE) ×3 IMPLANT
GOWN STRL REUS W/TWL LRG LVL3 (GOWN DISPOSABLE) ×4
GUIDEWIRE THREADED 2.8 (WIRE) ×9 IMPLANT
KIT BASIN OR (CUSTOM PROCEDURE TRAY) ×3 IMPLANT
KIT ROOM TURNOVER OR (KITS) ×3 IMPLANT
LINER BOOT UNIVERSAL DISP (MISCELLANEOUS) ×3 IMPLANT
MANIFOLD NEPTUNE II (INSTRUMENTS) ×3 IMPLANT
NEEDLE HYPO 25GX1X1/2 BEV (NEEDLE) IMPLANT
NS IRRIG 1000ML POUR BTL (IV SOLUTION) ×3 IMPLANT
PACK GENERAL/GYN (CUSTOM PROCEDURE TRAY) ×3 IMPLANT
PAD ARMBOARD 7.5X6 YLW CONV (MISCELLANEOUS) ×3 IMPLANT
SCREW CANN 16 THRD/85 7.3 (Screw) ×3 IMPLANT
SCREW CANN 16 THRD/90 7.3 (Screw) ×6 IMPLANT
STAPLER VISISTAT 35W (STAPLE) IMPLANT
SUT VIC AB 0 CT1 27 (SUTURE) ×2
SUT VIC AB 0 CT1 27XBRD ANBCTR (SUTURE) ×1 IMPLANT
SUT VIC AB 2-0 CT1 27 (SUTURE) ×2
SUT VIC AB 2-0 CT1 TAPERPNT 27 (SUTURE) ×1 IMPLANT
SYR CONTROL 10ML LL (SYRINGE) IMPLANT
TOWEL OR 17X24 6PK STRL BLUE (TOWEL DISPOSABLE) ×3 IMPLANT
TOWEL OR 17X26 10 PK STRL BLUE (TOWEL DISPOSABLE) ×3 IMPLANT
WATER STERILE IRR 1000ML POUR (IV SOLUTION) ×3 IMPLANT

## 2015-06-08 NOTE — H&P (Signed)
[facility] History and Physical  Geanine Vandekamp ZOX:[id number] DOB: 10/27/1934    PCP:   Delorse Lek, MD   Chief Complaint: fall and hurt left hip.   HPI: Terri Chen is an 81 y.o. female with hx of frequent falls, DM, HTN, HLD, COPD, anxiety, prior Fx of left humerus, fell and hurt her left hip.  X ray in the ER showed slightly displaced subcapital Fx of left femoral neck.  CXR was clear but showed left fifth rib Fx with subcutaneous air, but no pneumothorax, and X ray of the left elbow showed no Fx. Head CT was negative.  She denied HA, LOC, palpitation, fever, chills, CP or SOB.  EDP spoke with Dr Ophelia Charter of orthopedics, who recommended transfer to [facility] for ORIF.  Hospitalist was asked to admit her for same.   Rewiew of Systems:  Constitutional: Negative for malaise, fever and chills. No significant weight loss or weight gain Eyes: Negative for eye pain, redness and discharge, diplopia, visual changes, or flashes of light. ENMT: Negative for ear pain, hoarseness, nasal congestion, sinus pressure and sore throat. No headaches; tinnitus, drooling, or problem swallowing. Cardiovascular: Negative for chest pain, palpitations, diaphoresis, dyspnea and peripheral edema. ; No orthopnea, PND Respiratory: Negative for cough, hemoptysis, wheezing and stridor. No pleuritic chestpain. Gastrointestinal: Negative for nausea, vomiting, diarrhea, constipation, abdominal pain, melena, blood in stool, hematemesis, jaundice and rectal bleeding.    Genitourinary: Negative for frequency, dysuria, incontinence,flank pain and hematuria; Musculoskeletal: Negative for back pain and neck pain. Negative for swelling and trauma.;  Skin: . Negative for pruritus, rash, abrasions, bruising and skin lesion.; ulcerations Neuro: Negative for headache, lightheadedness and neck stiffness. Negative for weakness, altered level of consciousness , altered mental status, extremity weakness, burning feet,  involuntary movement, seizure and syncope.  Psych: negative for anxiety, depression, insomnia, tearfulness, panic attacks, hallucinations, paranoia, suicidal or homicidal ideation    Past Medical History  Diagnosis Date  . Diabetes mellitus   . Diabetes mellitus without complication (HCC)   . Hypertension   . High cholesterol   . COPD (chronic obstructive pulmonary disease) (HCC)   . Hyperlipidemia   . Hypertension   . 1979     left side weakness  . Pneumonia   . Anxiety     Past Surgical History  Procedure Laterality Date  . Lung surgery    . Knee surgery Right     arthroscopic  . Lung surgery    . Abdominal hysterectomy    . Eye surgery Bilateral     cataract surgery with lens implant  . Tonsillectomy    . Colonoscopy    . Orif humerus fracture Left 04/07/2015  . Orif humerus fracture Left 04/07/2015    Procedure: OPEN REDUCTION INTERNAL FIXATION (ORIF) PROXIMAL HUMERUS FRACTURE;  Surgeon: Eldred Manges, MD;  Location: [facility] OR;  Service: Orthopedics;  Laterality: Left;    Medications:  HOME MEDS: Prior to Admission medications   Medication Sig Start Date End Date Taking? Authorizing Provider  amLODipine (NORVASC) 5 MG tablet Take 5 mg by mouth daily with breakfast.     Historical Provider, MD  aspirin EC 81 MG tablet Take 81 mg by mouth daily with breakfast.     Historical Provider, MD  budesonide-formoterol (SYMBICORT) 160-4.5 MCG/ACT inhaler Inhale 2 puffs into the lungs 2 (two) times daily.    Historical Provider, MD  hydrochlorothiazide (HYDRODIURIL) 25 MG tablet Take 25 mg by mouth daily with breakfast.  Historical Provider, MD  metoprolol (LOPRESSOR) 100 MG tablet Take 100 mg by mouth daily with breakfast.  07/23/13   Historical Provider, MD  oxyCODONE-acetaminophen (ROXICET) 5-325 MG tablet 1 to 2 tablets every 4 to 6 hrs as needed for pain. 04/08/15   Eldred Manges, MD  pravastatin (PRAVACHOL) 40 MG tablet Take 40 mg by mouth daily after supper.     Historical  Provider, MD     Allergies:  No Known Allergies  Social History:   reports that she has been smoking Cigarettes.  She has a 30 pack-year smoking history. She has never used smokeless tobacco. She reports that she does not drink alcohol or use illicit drugs.  Family History: Family History  Problem Relation Age of Onset  . Diabetes Mother   . COPD Mother   . Heart attack Father      Physical Exam: Filed Vitals:   06/07/15 2151 06/07/15 2252 06/08/15 0000 06/08/15 0030  BP: 131/68 136/70 144/84 139/85  Pulse: 98 92 92 98  Temp: 98.7 F (37.1 C) 97.5 F (36.4 C)    TempSrc: Oral Oral    Resp: Height:  (1.651 m)     Weight: 65.772 kg (145 lb)     SpO2: 96% 96% 93% 94%   Blood pressure 139/85, pulse 98, temperature 97.5 F (36.4 C), temperature source Oral, resp. rate 10, height  (1.651 m), weight 65.772 kg (145 lb), SpO2 94 %.  GEN:  Pleasant patient lying in the stretcher in no acute distress; cooperative with exam. PSYCH:  alert and oriented x4; does not appear anxious or depressed; affect is appropriate. HEENT: Mucous membranes pink and anicteric; PERRLA; EOM intact; no cervical lymphadenopathy nor thyromegaly or carotid bruit; no JVD; There were no stridor. Neck is very supple. Breasts:: Not examined CHEST WALL: No tenderness CHEST: Normal respiration, clear to auscultation bilaterally.  HEART: Regular rate and rhythm.  There are no murmur, rub, or gallops.   BACK: No kyphosis or scoliosis; no CVA tenderness ABDOMEN: soft and non-tender; no masses, no organomegaly, normal abdominal bowel sounds; no pannus; no intertriginous candida. There is no rebound and no distention. Rectal Exam: Not done EXTREMITIES: No bone or joint deformity; age-appropriate arthropathy of the hands and knees; no edema; no ulcerations.  There is no calf tenderness.  I did not examine her left hip.  Genitalia: not examined PULSES: 2+ and symmetric SKIN: Normal hydration no  rash or ulceration CNS: Cranial nerves 2-12 grossly intact no focal lateralizing neurologic deficit.  Speech is fluent; uvula elevated with phonation, facial symmetry and tongue midline. DTR are normal bilaterally, cerebella exam is intact, barbinski is negative and strengths are equaled bilaterally.  No sensory loss.   Labs on Admission:  Basic Metabolic Panel:  Recent Labs Lab 06/07/15 2324  NA 140  K 3.1*  CL 102  CO2 27  GLUCOSE 188*  BUN 12  CREATININE 1.30*  CALCIUM 9.2   CBC:  Recent Labs Lab 06/07/15 2324  WBC 15.7*  NEUTROABS 13.4*  HGB 14.3  HCT 42.3  MCV 92.6  PLT 264    Radiological Exams on Admission: Dg Chest 1 View  06/07/2015  CLINICAL DATA:  Pt states she has fallen 4 times in the last few days. Pt states she fell tonight getting out of her chair and has had Lt hip pain and Lt elbow pain since. Abrasions to posterior Lt elbow. Hx diabetes, HTN, COPD, ORIF LEFT HUMERUS FRACTURE 04/07/2015,  smoker EXAM: CHEST 1 VIEW COMPARISON:  None. FINDINGS: There is a fracture of the lateral left fifth rib, mildly displaced approximately 1/2 shaft width. There is subcutaneous air in chest lateral and inferior to this, but no evidence of a pneumothorax. No other convincing fracture. Lungs are mildly hyperexpanded. There is mild linear scarring or atelectasis in the medial left lung base and minor upper lobe scarring. No evidence of pneumonia or pulmonary edema. No pleural effusion. Cardiac silhouette is normal in size. No mediastinal or hilar masses. IMPRESSION: 1. Mildly displaced fracture of the left lateral fifth rib. Although there is a small amount of adjacent subcutaneous air, there is no evidence of a pneumothorax. 2. No other acute findings. Electronically Signed   By: Amie Portland M.D.   On: 06/07/2015 23:11   Dg Elbow Complete Left  06/07/2015  CLINICAL DATA:  Status post fall 4 times, with left elbow pain. Initial encounter. EXAM: LEFT ELBOW - COMPLETE 3+ VIEW  COMPARISON:  None. FINDINGS: There is no evidence of fracture or dislocation. There appears to be some degree of chronic deformity of the distal humerus. The visualized joint spaces are preserved. No significant joint effusion is identified. The soft tissues are unremarkable in appearance. IMPRESSION: No evidence of acute fracture or dislocation. Underlying apparent chronic deformity of the distal humerus. Electronically Signed   By: Roanna Raider M.D.   On: 06/07/2015 22:50   Ct Head Wo Contrast  06/07/2015  CLINICAL DATA:  Multiple recent falls, with generalized weakness. Initial encounter. EXAM: CT HEAD WITHOUT CONTRAST TECHNIQUE: Contiguous axial images were obtained from the base of the skull through the vertex without intravenous contrast. COMPARISON:  None. FINDINGS: There is no evidence of acute infarction, mass lesion, or intra- or extra-axial hemorrhage on CT. Prominence of ventricles and sulci reflects mild cortical volume loss. Scattered periventricular and subcortical white matter change likely reflects small vessel ischemic microangiopathy. Cerebellar atrophy is noted. The brainstem and fourth ventricle are within normal limits. The basal ganglia are unremarkable in appearance. The cerebral hemispheres demonstrate grossly normal gray-white differentiation. No mass effect or midline shift is seen. There is no evidence of fracture; visualized osseous structures are unremarkable in appearance. The visualized portions of the orbits are within normal limits. The paranasal sinuses and mastoid air cells are well-aerated. No significant soft tissue abnormalities are seen. IMPRESSION: 1. No acute intracranial pathology seen on CT. 2. Mild cortical volume loss and scattered small vessel ischemic microangiopathy. Electronically Signed   By: Roanna Raider M.D.   On: 06/07/2015 23:59   Dg Hip Unilat With Pelvis 2-3 Views Left  06/07/2015  CLINICAL DATA:  Status post falls, with left hip pain. Initial  encounter. EXAM: DG HIP (WITH OR WITHOUT PELVIS) 2-3V LEFT COMPARISON:  None. FINDINGS: There is a minimally displaced subcapital fracture of the left femoral neck. The left femoral head remains seated at the acetabulum. The right hip joint is unremarkable. No significant degenerative change is appreciated. The sacroiliac joints are unremarkable in appearance. The visualized bowel gas pattern is grossly unremarkable in appearance. Scattered phleboliths are noted within the pelvis. Diffuse vascular calcifications are seen. IMPRESSION: 1. Minimally displaced subcapital fracture of the left femoral neck. 2. Diffuse vascular calcifications seen. Electronically Signed   By: Roanna Raider M.D.   On: 06/07/2015 22:46    EKG: Independently reviewed.    Assessment/Plan Present on Admission:  . Fracture, humerus, proximal . HTN (hypertension) . HLD (hyperlipidemia) . Hip fracture (HCC)  PLAN:  Left hip  Fx:   Will admit to [facility].  She was made NPO for ORIF.  Will give IVF.   Orthopedic was consulted.  Fx left rib: Pain control.  Hypokalemia:  Will replete K. HTN:  Controlled.  Will continue her meds.  HLD:  Continue with statin.   Other plans as per orders. Code Status: FULL Unk Lightning, MD. FACP [facility] Pager [phone] 7pm to 7am.  06/08/2015, 1:21 AM

## 2015-06-08 NOTE — Consult Note (Signed)
Patient ID: Terri Chen MRN: [id number] DOB/AGE: 06-10-1934 81 y.o.  Admit date: 06/07/2015  Admission Diagnoses:  Active Problems:   Fracture, humerus, proximal   DM (diabetes mellitus) (HCC)   HTN (hypertension)   HLD (hyperlipidemia)   Hip fracture (HCC)   HPI: Patient resides at home and was in the process of looking to move to an assisted living  facility and recently had a fall.  Unable to weightbear. S/p orif proximal humerus fracture which is doing well.   Past Medical History: Past Medical History  Diagnosis Date  . Diabetes mellitus   . Diabetes mellitus without complication (HCC)   . Hypertension   . High cholesterol   . COPD (chronic obstructive pulmonary disease) (HCC)   . Hyperlipidemia   . Hypertension   . 1979     left side weakness  . Pneumonia   . Anxiety     Surgical History: Past Surgical History  Procedure Laterality Date  . Lung surgery    . Knee surgery Right     arthroscopic  . Lung surgery    . Abdominal hysterectomy    . Eye surgery Bilateral     cataract surgery with lens implant  . Tonsillectomy    . Colonoscopy    . Orif humerus fracture Left 04/07/2015  . Orif humerus fracture Left 04/07/2015    Procedure: OPEN REDUCTION INTERNAL FIXATION (ORIF) PROXIMAL HUMERUS FRACTURE;  Surgeon: Eldred Manges, MD;  Location: [facility] OR;  Service: Orthopedics;  Laterality: Left;    Family History: Family History  Problem Relation Age of Onset  . Diabetes Mother   . COPD Mother   . Heart attack Father     Social History: Social History   Social History  . Marital Status: Widowed    Spouse Name: N/A  . Number of Children: N/A  . Years of Education: N/A   Occupational History  . Not on file.   Social History Main Topics  . Smoking status: Current Every Day Smoker -- 0.50 packs/day for 60 years    Types: Cigarettes  . Smokeless tobacco: Never Used  . Alcohol Use: No  . Drug Use: No  . Sexual Activity: Not on file   Other  Topics Concern  . Not on file   Social History Narrative   ** Merged History Encounter **        Allergies: Tramadol  Medications: I have reviewed the patient's current medications.  Vital Signs: Patient Vitals for the past 24 hrs:  BP Temp Temp src Pulse Resp SpO2 Height Weight  06/08/15 0357 (!) 152/83 mmHg 98.1 F (36.7 C) Oral 97 16 97 % - -  06/08/15 0230 161/85 mmHg - - 101 20 96 % - -  06/08/15 0200 168/95 mmHg - - 100 14 96 % - -  06/08/15 0130 (!) 164/105 mmHg - - 106 14 96 % - -  06/08/15 0100 141/94 mmHg - - 98 19 96 % - -  06/08/15 0030 139/85 mmHg - - 98 10 94 % - -  06/08/15 0000 144/84 mmHg - - 92 (!) 8 93 % - -  06/07/15 2252 136/70 mmHg 97.5 F (36.4 C) Oral 92 22 96 % - -  06/07/15 2151 131/68 mmHg 98.7 F (37.1 C) Oral 98 18 96 %  (1.651 m) 65.772 kg (145 lb)    Radiology: Dg Chest 1 View  06/07/2015  CLINICAL DATA:  Pt states she has fallen 4 times in  the last few days. Pt states she fell tonight getting out of her chair and has had Lt hip pain and Lt elbow pain since. Abrasions to posterior Lt elbow. Hx diabetes, HTN, COPD, ORIF LEFT HUMERUS FRACTURE 04/07/2015, smoker EXAM: CHEST 1 VIEW COMPARISON:  None. FINDINGS: There is a fracture of the lateral left fifth rib, mildly displaced approximately 1/2 shaft width. There is subcutaneous air in chest lateral and inferior to this, but no evidence of a pneumothorax. No other convincing fracture. Lungs are mildly hyperexpanded. There is mild linear scarring or atelectasis in the medial left lung base and minor upper lobe scarring. No evidence of pneumonia or pulmonary edema. No pleural effusion. Cardiac silhouette is normal in size. No mediastinal or hilar masses. IMPRESSION: 1. Mildly displaced fracture of the left lateral fifth rib. Although there is a small amount of adjacent subcutaneous air, there is no evidence of a pneumothorax. 2. No other acute findings. Electronically Signed   By: Amie Portland M.D.    On: 06/07/2015 23:11   Dg Elbow Complete Left  06/07/2015  CLINICAL DATA:  Status post fall 4 times, with left elbow pain. Initial encounter. EXAM: LEFT ELBOW - COMPLETE 3+ VIEW COMPARISON:  None. FINDINGS: There is no evidence of fracture or dislocation. There appears to be some degree of chronic deformity of the distal humerus. The visualized joint spaces are preserved. No significant joint effusion is identified. The soft tissues are unremarkable in appearance. IMPRESSION: No evidence of acute fracture or dislocation. Underlying apparent chronic deformity of the distal humerus. Electronically Signed   By: Roanna Raider M.D.   On: 06/07/2015 22:50   Ct Head Wo Contrast  06/07/2015  CLINICAL DATA:  Multiple recent falls, with generalized weakness. Initial encounter. EXAM: CT HEAD WITHOUT CONTRAST TECHNIQUE: Contiguous axial images were obtained from the base of the skull through the vertex without intravenous contrast. COMPARISON:  None. FINDINGS: There is no evidence of acute infarction, mass lesion, or intra- or extra-axial hemorrhage on CT. Prominence of ventricles and sulci reflects mild cortical volume loss. Scattered periventricular and subcortical white matter change likely reflects small vessel ischemic microangiopathy. Cerebellar atrophy is noted. The brainstem and fourth ventricle are within normal limits. The basal ganglia are unremarkable in appearance. The cerebral hemispheres demonstrate grossly normal gray-white differentiation. No mass effect or midline shift is seen. There is no evidence of fracture; visualized osseous structures are unremarkable in appearance. The visualized portions of the orbits are within normal limits. The paranasal sinuses and mastoid air cells are well-aerated. No significant soft tissue abnormalities are seen. IMPRESSION: 1. No acute intracranial pathology seen on CT. 2. Mild cortical volume loss and scattered small vessel ischemic microangiopathy. Electronically  Signed   By: Roanna Raider M.D.   On: 06/07/2015 23:59   Dg Hip Unilat With Pelvis 2-3 Views Left  06/07/2015  CLINICAL DATA:  Status post falls, with left hip pain. Initial encounter. EXAM: DG HIP (WITH OR WITHOUT PELVIS) 2-3V LEFT COMPARISON:  None. FINDINGS: There is a minimally displaced subcapital fracture of the left femoral neck. The left femoral head remains seated at the acetabulum. The right hip joint is unremarkable. No significant degenerative change is appreciated. The sacroiliac joints are unremarkable in appearance. The visualized bowel gas pattern is grossly unremarkable in appearance. Scattered phleboliths are noted within the pelvis. Diffuse vascular calcifications are seen. IMPRESSION: 1. Minimally displaced subcapital fracture of the left femoral neck. 2. Diffuse vascular calcifications seen. Electronically Signed   By: Roanna Raider  M.D.   On: 06/07/2015 22:46    Labs:  Recent Labs  06/07/15 2324 06/08/15 0646  WBC 15.7* 11.7*  RBC 4.57 4.61  HCT 42.3 42.3  PLT 264 254    Recent Labs  06/07/15 2324 06/08/15 0646  NA 140  --   K 3.1*  --   CL 102  --   CO2 27  --   BUN 12  --   CREATININE 1.30* 1.20*  GLUCOSE 188*  --   CALCIUM 9.2  --     Recent Labs  06/07/15 2324  INR 1.02    Review of Systems: Review of Systems  Constitutional: Negative.   HENT: Negative.   Respiratory: Negative.   Cardiovascular: Negative.   Gastrointestinal: Negative.   Genitourinary: Negative.   Musculoskeletal: Positive for joint pain.  Neurological: Negative.     Physical Exam: Alert and oriented.  Head normocephalic. EOMI.  No respiratory distress.  abd flat, nondistended, nontender.  Left hip pain with attempted int/ext rotation.  Left leg leg discrepancy.  Skin warm and dry.    Assessment and Plan: Left subcapital hip fracture.  Patient will be taken to the OR this afternoon for hip pinning procedure. Surgical procedure discussed and voices understanding of the  need for surgery.   Mark C. Ophelia Charter MD [facility] orthopedics [phone]

## 2015-06-08 NOTE — Progress Notes (Signed)
[facility] PROGRESS NOTE  Demoni Parmar GNF:[id number] DOB: 1935-01-08 DOA: 06/07/2015 PCP: Delorse Lek, MD  HPI/Brief narrative 81 y.o. female with hx of frequent falls, DM, HTN, HLD, COPD, anxiety, prior Fx of left humerus, fell and hurt her left hip. X ray in the ER showed slightly displaced subcapital Fx of left femoral neck. CXR was clear but showed left fifth rib Fx with subcutaneous air, but no pneumothorax, and X ray of the left elbow showed no Fx. Head CT was negative. She denied HA, LOC, palpitation, fever, chills, CP or SOB. EDP spoke with Dr Ophelia Charter of orthopedics, who recommended transfer to [facility] for ORIF. Hospitalist was asked to admit her for same.  Assessment/Plan: 1. L hip fracture 1. Orthopedic Surgery consulted 2. Patient is planned for surgery this afternoon 3. Continue pain control as tolerated 2. L rib fracture 1. Mildly displaced fracture of L lateral 5th rib 2. Stable 3. Continue analgesics as tolerated 3. Hypokalemia 1. Replaced 2. Cont to follow lytes 4. HTN 1. BP stable at present 2. Cont current regimen 5. HLD 1. Currently on statin 6. DVT prophylaxis 1. Heparin subQ  Code Status: Full Family Communication: Pt in room Disposition Plan: Dispo plans per PT eval. Suspect ?SNF post-op   Consultants:  Orthopedic Surgery  Procedures:    Antibiotics: Anti-infectives    None      HPI/Subjective: Eager to have surgery this afternoon  Objective: Filed Vitals:   06/08/15 0130 06/08/15 0200 06/08/15 0230 06/08/15 0357  BP: 164/105 168/95 161/85 152/83  Pulse: 106 100 101 97  Temp:    98.1 F (36.7 C)  TempSrc:    Oral  Resp: Height:      Weight:      SpO2: 96% 96% 96% 97%   No intake or output data in the 24 hours ending 06/08/15 1452 Filed Weights   06/07/15 2151  Weight: 65.772 kg (145 lb)    Exam:   General:  Awake, in nad  Cardiovascular: regular, s1, s2  Respiratory: normal resp effort, no  wheezing  Abdomen: soft, nondistended  Musculoskeletal: perfused, no clubbing   Data Reviewed: Basic Metabolic Panel:  Recent Labs Lab 06/07/15 2324 06/08/15 0646  NA 140  --   K 3.1*  --   CL 102  --   CO2 27  --   GLUCOSE 188*  --   BUN 12  --   CREATININE 1.30* 1.20*  CALCIUM 9.2  --    Liver Function Tests: No results for input(s): AST, ALT, ALKPHOS, BILITOT, PROT, ALBUMIN in the last 168 hours. No results for input(s): LIPASE, AMYLASE in the last 168 hours. No results for input(s): AMMONIA in the last 168 hours. CBC:  Recent Labs Lab 06/07/15 2324 06/08/15 0646  WBC 15.7* 11.7*  NEUTROABS 13.4*  --   HGB 14.3 14.0  HCT 42.3 42.3  MCV 92.6 91.8  PLT 264 254   Cardiac Enzymes: No results for input(s): CKTOTAL, CKMB, CKMBINDEX, TROPONINI in the last 168 hours. BNP (last 3 results) No results for input(s): BNP in the last 8760 hours.  ProBNP (last 3 results) No results for input(s): PROBNP in the last 8760 hours.  CBG:  Recent Labs Lab 06/08/15 0200 06/08/15 0355 06/08/15 0913 06/08/15 1127  GLUCAP 165* 141* 131* 108*    No results found for this or any previous visit (from the past 240 hour(s)).   Studies: Dg Chest 1 View  06/07/2015  CLINICAL DATA:  Pt states she has fallen 4 times in the last few days. Pt states she fell tonight getting out of her chair and has had Lt hip pain and Lt elbow pain since. Abrasions to posterior Lt elbow. Hx diabetes, HTN, COPD, ORIF LEFT HUMERUS FRACTURE 04/07/2015, smoker EXAM: CHEST 1 VIEW COMPARISON:  None. FINDINGS: There is a fracture of the lateral left fifth rib, mildly displaced approximately 1/2 shaft width. There is subcutaneous air in chest lateral and inferior to this, but no evidence of a pneumothorax. No other convincing fracture. Lungs are mildly hyperexpanded. There is mild linear scarring or atelectasis in the medial left lung base and minor upper lobe scarring. No evidence of pneumonia or pulmonary  edema. No pleural effusion. Cardiac silhouette is normal in size. No mediastinal or hilar masses. IMPRESSION: 1. Mildly displaced fracture of the left lateral fifth rib. Although there is a small amount of adjacent subcutaneous air, there is no evidence of a pneumothorax. 2. No other acute findings. Electronically Signed   By: Amie Portland M.D.   On: 06/07/2015 23:11   Dg Elbow Complete Left  06/07/2015  CLINICAL DATA:  Status post fall 4 times, with left elbow pain. Initial encounter. EXAM: LEFT ELBOW - COMPLETE 3+ VIEW COMPARISON:  None. FINDINGS: There is no evidence of fracture or dislocation. There appears to be some degree of chronic deformity of the distal humerus. The visualized joint spaces are preserved. No significant joint effusion is identified. The soft tissues are unremarkable in appearance. IMPRESSION: No evidence of acute fracture or dislocation. Underlying apparent chronic deformity of the distal humerus. Electronically Signed   By: Roanna Raider M.D.   On: 06/07/2015 22:50   Ct Head Wo Contrast  06/07/2015  CLINICAL DATA:  Multiple recent falls, with generalized weakness. Initial encounter. EXAM: CT HEAD WITHOUT CONTRAST TECHNIQUE: Contiguous axial images were obtained from the base of the skull through the vertex without intravenous contrast. COMPARISON:  None. FINDINGS: There is no evidence of acute infarction, mass lesion, or intra- or extra-axial hemorrhage on CT. Prominence of ventricles and sulci reflects mild cortical volume loss. Scattered periventricular and subcortical white matter change likely reflects small vessel ischemic microangiopathy. Cerebellar atrophy is noted. The brainstem and fourth ventricle are within normal limits. The basal ganglia are unremarkable in appearance. The cerebral hemispheres demonstrate grossly normal gray-white differentiation. No mass effect or midline shift is seen. There is no evidence of fracture; visualized osseous structures are unremarkable  in appearance. The visualized portions of the orbits are within normal limits. The paranasal sinuses and mastoid air cells are well-aerated. No significant soft tissue abnormalities are seen. IMPRESSION: 1. No acute intracranial pathology seen on CT. 2. Mild cortical volume loss and scattered small vessel ischemic microangiopathy. Electronically Signed   By: Roanna Raider M.D.   On: 06/07/2015 23:59   Dg Hip Unilat With Pelvis 2-3 Views Left  06/07/2015  CLINICAL DATA:  Status post falls, with left hip pain. Initial encounter. EXAM: DG HIP (WITH OR WITHOUT PELVIS) 2-3V LEFT COMPARISON:  None. FINDINGS: There is a minimally displaced subcapital fracture of the left femoral neck. The left femoral head remains seated at the acetabulum. The right hip joint is unremarkable. No significant degenerative change is appreciated. The sacroiliac joints are unremarkable in appearance. The visualized bowel gas pattern is grossly unremarkable in appearance. Scattered phleboliths are noted within the pelvis. Diffuse vascular calcifications are seen. IMPRESSION: 1. Minimally displaced subcapital fracture of the left femoral neck. 2. Diffuse vascular calcifications seen.  Electronically Signed   By: Roanna Raider M.D.   On: 06/07/2015 22:46    Scheduled Meds: . [MAR Hold] amLODipine  5 mg Oral Daily  . [MAR Hold] aspirin EC  81 mg Oral Daily  . [MAR Hold] heparin  5,000 Units Subcutaneous 3 times per day  . [MAR Hold] insulin aspart  0-9 Units Subcutaneous 6 times per day  . [MAR Hold] metoprolol  100 mg Oral Daily  . [MAR Hold] mometasone-formoterol  2 puff Inhalation BID  . potassium chloride  10 mEq Intravenous Q1 Hr x 4  . [MAR Hold] pravastatin  40 mg Oral QPC supper   Continuous Infusions: . dextrose 5 % and 0.9% NaCl 50 mL/hr at 06/08/15 0413    Active Problems:   Fracture, humerus, proximal   DM (diabetes mellitus) (HCC)   HTN (hypertension)   HLD (hyperlipidemia)   Hip fracture (HCC)  Maclean Foister,  [place] K  [facility] Pager [phone]. If 7PM-7AM, please contact night-coverage at [email or web address], password [facility] 06/08/2015, 2:52 PM  LOS: 0 days

## 2015-06-08 NOTE — H&P (View-Only) (Signed)
Patient ID: Terri Chen MRN: [id number] DOB/AGE: 06-10-1934 81 y.o.  Admit date: 06/07/2015  Admission Diagnoses:  Active Problems:   Fracture, humerus, proximal   DM (diabetes mellitus) (HCC)   HTN (hypertension)   HLD (hyperlipidemia)   Hip fracture (HCC)   HPI: Patient resides at home and was in the process of looking to move to an assisted living  facility and recently had a fall.  Unable to weightbear. S/p orif proximal humerus fracture which is doing well.   Past Medical History: Past Medical History  Diagnosis Date  . Diabetes mellitus   . Diabetes mellitus without complication (HCC)   . Hypertension   . High cholesterol   . COPD (chronic obstructive pulmonary disease) (HCC)   . Hyperlipidemia   . Hypertension   . 1979     left side weakness  . Pneumonia   . Anxiety     Surgical History: Past Surgical History  Procedure Laterality Date  . Lung surgery    . Knee surgery Right     arthroscopic  . Lung surgery    . Abdominal hysterectomy    . Eye surgery Bilateral     cataract surgery with lens implant  . Tonsillectomy    . Colonoscopy    . Orif humerus fracture Left 04/07/2015  . Orif humerus fracture Left 04/07/2015    Procedure: OPEN REDUCTION INTERNAL FIXATION (ORIF) PROXIMAL HUMERUS FRACTURE;  Surgeon: Eldred Manges, MD;  Location: [facility] OR;  Service: Orthopedics;  Laterality: Left;    Family History: Family History  Problem Relation Age of Onset  . Diabetes Mother   . COPD Mother   . Heart attack Father     Social History: Social History   Social History  . Marital Status: Widowed    Spouse Name: N/A  . Number of Children: N/A  . Years of Education: N/A   Occupational History  . Not on file.   Social History Main Topics  . Smoking status: Current Every Day Smoker -- 0.50 packs/day for 60 years    Types: Cigarettes  . Smokeless tobacco: Never Used  . Alcohol Use: No  . Drug Use: No  . Sexual Activity: Not on file   Other  Topics Concern  . Not on file   Social History Narrative   ** Merged History Encounter **        Allergies: Tramadol  Medications: I have reviewed the patient's current medications.  Vital Signs: Patient Vitals for the past 24 hrs:  BP Temp Temp src Pulse Resp SpO2 Height Weight  06/08/15 0357 (!) 152/83 mmHg 98.1 F (36.7 C) Oral 97 16 97 % - -  06/08/15 0230 161/85 mmHg - - 101 20 96 % - -  06/08/15 0200 168/95 mmHg - - 100 14 96 % - -  06/08/15 0130 (!) 164/105 mmHg - - 106 14 96 % - -  06/08/15 0100 141/94 mmHg - - 98 19 96 % - -  06/08/15 0030 139/85 mmHg - - 98 10 94 % - -  06/08/15 0000 144/84 mmHg - - 92 (!) 8 93 % - -  06/07/15 2252 136/70 mmHg 97.5 F (36.4 C) Oral 92 22 96 % - -  06/07/15 2151 131/68 mmHg 98.7 F (37.1 C) Oral 98 18 96 %  (1.651 m) 65.772 kg (145 lb)    Radiology: Dg Chest 1 View  06/07/2015  CLINICAL DATA:  Pt states she has fallen 4 times in  the last few days. Pt states she fell tonight getting out of her chair and has had Lt hip pain and Lt elbow pain since. Abrasions to posterior Lt elbow. Hx diabetes, HTN, COPD, ORIF LEFT HUMERUS FRACTURE 04/07/2015, smoker EXAM: CHEST 1 VIEW COMPARISON:  None. FINDINGS: There is a fracture of the lateral left fifth rib, mildly displaced approximately 1/2 shaft width. There is subcutaneous air in chest lateral and inferior to this, but no evidence of a pneumothorax. No other convincing fracture. Lungs are mildly hyperexpanded. There is mild linear scarring or atelectasis in the medial left lung base and minor upper lobe scarring. No evidence of pneumonia or pulmonary edema. No pleural effusion. Cardiac silhouette is normal in size. No mediastinal or hilar masses. IMPRESSION: 1. Mildly displaced fracture of the left lateral fifth rib. Although there is a small amount of adjacent subcutaneous air, there is no evidence of a pneumothorax. 2. No other acute findings. Electronically Signed   By: Amie Portland M.D.    On: 06/07/2015 23:11   Dg Elbow Complete Left  06/07/2015  CLINICAL DATA:  Status post fall 4 times, with left elbow pain. Initial encounter. EXAM: LEFT ELBOW - COMPLETE 3+ VIEW COMPARISON:  None. FINDINGS: There is no evidence of fracture or dislocation. There appears to be some degree of chronic deformity of the distal humerus. The visualized joint spaces are preserved. No significant joint effusion is identified. The soft tissues are unremarkable in appearance. IMPRESSION: No evidence of acute fracture or dislocation. Underlying apparent chronic deformity of the distal humerus. Electronically Signed   By: Roanna Raider M.D.   On: 06/07/2015 22:50   Ct Head Wo Contrast  06/07/2015  CLINICAL DATA:  Multiple recent falls, with generalized weakness. Initial encounter. EXAM: CT HEAD WITHOUT CONTRAST TECHNIQUE: Contiguous axial images were obtained from the base of the skull through the vertex without intravenous contrast. COMPARISON:  None. FINDINGS: There is no evidence of acute infarction, mass lesion, or intra- or extra-axial hemorrhage on CT. Prominence of ventricles and sulci reflects mild cortical volume loss. Scattered periventricular and subcortical white matter change likely reflects small vessel ischemic microangiopathy. Cerebellar atrophy is noted. The brainstem and fourth ventricle are within normal limits. The basal ganglia are unremarkable in appearance. The cerebral hemispheres demonstrate grossly normal gray-white differentiation. No mass effect or midline shift is seen. There is no evidence of fracture; visualized osseous structures are unremarkable in appearance. The visualized portions of the orbits are within normal limits. The paranasal sinuses and mastoid air cells are well-aerated. No significant soft tissue abnormalities are seen. IMPRESSION: 1. No acute intracranial pathology seen on CT. 2. Mild cortical volume loss and scattered small vessel ischemic microangiopathy. Electronically  Signed   By: Roanna Raider M.D.   On: 06/07/2015 23:59   Dg Hip Unilat With Pelvis 2-3 Views Left  06/07/2015  CLINICAL DATA:  Status post falls, with left hip pain. Initial encounter. EXAM: DG HIP (WITH OR WITHOUT PELVIS) 2-3V LEFT COMPARISON:  None. FINDINGS: There is a minimally displaced subcapital fracture of the left femoral neck. The left femoral head remains seated at the acetabulum. The right hip joint is unremarkable. No significant degenerative change is appreciated. The sacroiliac joints are unremarkable in appearance. The visualized bowel gas pattern is grossly unremarkable in appearance. Scattered phleboliths are noted within the pelvis. Diffuse vascular calcifications are seen. IMPRESSION: 1. Minimally displaced subcapital fracture of the left femoral neck. 2. Diffuse vascular calcifications seen. Electronically Signed   By: Roanna Raider  M.D.   On: 06/07/2015 22:46    Labs:  Recent Labs  06/07/15 2324 06/08/15 0646  WBC 15.7* 11.7*  RBC 4.57 4.61  HCT 42.3 42.3  PLT 264 254    Recent Labs  06/07/15 2324 06/08/15 0646  NA 140  --   K 3.1*  --   CL 102  --   CO2 27  --   BUN 12  --   CREATININE 1.30* 1.20*  GLUCOSE 188*  --   CALCIUM 9.2  --     Recent Labs  06/07/15 2324  INR 1.02    Review of Systems: Review of Systems  Constitutional: Negative.   HENT: Negative.   Respiratory: Negative.   Cardiovascular: Negative.   Gastrointestinal: Negative.   Genitourinary: Negative.   Musculoskeletal: Positive for joint pain.  Neurological: Negative.     Physical Exam: Alert and oriented.  Head normocephalic. EOMI.  No respiratory distress.  abd flat, nondistended, nontender.  Left hip pain with attempted int/ext rotation.  Left leg leg discrepancy.  Skin warm and dry.    Assessment and Plan: Left subcapital hip fracture.  Patient will be taken to the OR this afternoon for hip pinning procedure. Surgical procedure discussed and voices understanding of the  need for surgery.   Taevyn Hausen C. Ophelia Charter MD [facility] orthopedics [phone]

## 2015-06-08 NOTE — Progress Notes (Signed)
Received call from [facility] re transfer of this patient from [facility] for a left hip fracture. Noted that she is in house this AM. Dr. Ophelia Charter is aware of this patient and her location and will see.

## 2015-06-08 NOTE — Brief Op Note (Signed)
06/07/2015 - 06/08/2015  5:02 PM  PATIENT:  Terri Chen  81 y.o. female  PRE-OPERATIVE DIAGNOSIS:  Left Femoral Neck Fx  POST-OPERATIVE DIAGNOSIS:  Left Femoral Neck Fx  PROCEDURE:  Procedure(s): CANNULATED HIP PINNING (Left)  SURGEON:  Surgeon(s) and Role:    * Eldred Manges, MD - Primary  PHYSICIAN ASSISTANT:   ASSISTANTS: none   ANESTHESIA:   local and general  EBL:  Total I/O In: 250 [IV Piggyback:250] Out: 700 [Urine:700]  BLOOD ADMINISTERED:none  DRAINS: none   LOCAL MEDICATIONS USED:  MARCAINE     SPECIMEN:  No Specimen  DISPOSITION OF SPECIMEN:  N/A  COUNTS:  YES  TOURNIQUET:  * No tourniquets in log *  DICTATION: .Other Dictation: Dictation Number [id number]  PLAN OF CARE: already inpt  PATIENT DISPOSITION:  PACU - hemodynamically stable.   Delay start of Pharmacological VTE agent (>24hrs) due to surgical blood loss or risk of bleeding: yes

## 2015-06-08 NOTE — Anesthesia Procedure Notes (Signed)
Procedure Name: Intubation Date/Time: 06/08/2015 3:55 PM Performed by: Fransisca Kaufmann Pre-anesthesia Checklist: Patient identified, Emergency Drugs available, Suction available, Patient being monitored and Timeout performed Patient Re-evaluated:Patient Re-evaluated prior to inductionOxygen Delivery Method: Circle system utilized Preoxygenation: Pre-oxygenation with 100% oxygen Intubation Type: IV induction Ventilation: Mask ventilation without difficulty Laryngoscope Size: Mac and 3 Grade View: Grade I Tube type: Oral Tube size: 7.5 mm Number of attempts: 1 Airway Equipment and Method: Stylet Placement Confirmation: ETT inserted through vocal cords under direct vision,  positive ETCO2 and breath sounds checked- equal and bilateral Secured at: 22 cm Tube secured with: Tape Dental Injury: Teeth and Oropharynx as per pre-operative assessment

## 2015-06-08 NOTE — Progress Notes (Signed)
Patient ID: Terri Chen, female   DOB: 1935/02/15, 81 y.o.   MRN: [id number] Patient known to me from prox humerus fracture . Falls, was in process of assisted living placement. Fell with left femoral neck fracture. Posted for surgery at about 3:30 PM today for small incision and cannulated screw fixation to stabilize femoral neck fracture.  Full consult to follow .   Discussed with patient and she agrees to proceed.

## 2015-06-08 NOTE — Progress Notes (Signed)
Terri Chen, NT took pt's dentures to room on 5N, placed in a labelled denture cup.

## 2015-06-08 NOTE — Op Note (Signed)
NAME:  Terri Chen, Terri Chen NO.:  [id number]  MEDICAL RECORD NO.:  [id number]  LOCATION:  5N06C                        FACILITY:  [facility]  PHYSICIAN:  Dhamar Gregory C. Ophelia Charter, M.D.    DATE OF BIRTH:  26-May-1934  DATE OF PROCEDURE:  06/08/2015 DATE OF DISCHARGE:                              OPERATIVE REPORT   PREOPERATIVE DIAGNOSIS:  Left femoral neck fracture.  POSTOPERATIVE DIAGNOSIS:  Left femoral neck fracture.  PROCEDURE:  Reduction and cannulated screw fixation, left femoral neck fracture.  SURGEON:  Quenton Recendez C. Ophelia Charter, M.D.  ANESTHESIA:  General.  IMPLANTS:  7.3 Synthes stainless steel cannulated screws x3.  PROCEDURE IN DETAIL:  After induction of general anesthesia, standard prepping and draping after the patient was placed on Hanna table with reduction and positioning.  Well leg holder was attached to the opposite leg long shaft getting the hip flexed at 90 degrees and knee flexion 90 degrees with careful padding.  With the reduction and good position in AP and lateral, prepping with DuraPrep with the usual large shower curtain and Betadine, Steri-Drape was applied.  C-arm was brought in and K-wires held anteriorly over the neck, marked with a skin marker. Incision was made.  Subtroch region relatively narrow, relatively thin patient.  K-wire was placed.  Additionally, 2nd and 3rd K-wires were used using the guide to pass across the fracture with 2 screws low in the neck, 1 center-center.  Once measurements and screws were placed, measured between 85 and 90 mm.  Once all screws were tightened down against the cortex, the Hanna table leg was relaxed so the hip could be rotated directly under fluoro and then also flexed up and internally and externally rotated to make sure there was no penetration and all screws were in good position across the fracture.  There was no penetration into the joint during the procedure.  Irrigation with saline solution. Closure with 0  Vicryl in the deep fascia, 2-0 in the subcutaneous tissue, skin with staple closure, postop dressing and transferred to recovery room after Marcaine infiltration.     Xachary Hambly C. Ophelia Charter, M.D.     MCY/MEDQ  D:  06/08/2015  T:  06/08/2015  Job:  [id number]

## 2015-06-08 NOTE — Anesthesia Preprocedure Evaluation (Signed)
Anesthesia Evaluation  Patient identified by MRN, date of birth, ID band Patient awake    Reviewed: Allergy & Precautions, NPO status , Patient's Chart, lab work & pertinent test results  Airway Mallampati: II  TM Distance: >3 FB Neck ROM: Full    Dental no notable dental hx.    Pulmonary COPD, Current Smoker,    Pulmonary exam normal breath sounds clear to auscultation       Cardiovascular hypertension, Pt. on medications Normal cardiovascular exam Rhythm:Regular Rate:Normal     Neuro/Psych negative neurological ROS  negative psych ROS   GI/Hepatic negative GI ROS, Neg liver ROS,   Endo/Other  diabetes  Renal/GU negative Renal ROS  negative genitourinary   Musculoskeletal negative musculoskeletal ROS (+)   Abdominal   Peds negative pediatric ROS (+)  Hematology negative hematology ROS (+)   Anesthesia Other Findings   Reproductive/Obstetrics negative OB ROS                             Anesthesia Physical Anesthesia Plan  ASA: III  Anesthesia Plan: General   Post-op Pain Management:    Induction: Intravenous  Airway Management Planned: Oral ETT  Additional Equipment:   Intra-op Plan:   Post-operative Plan: Extubation in OR  Informed Consent: I have reviewed the patients History and Physical, chart, labs and discussed the procedure including the risks, benefits and alternatives for the proposed anesthesia with the patient or authorized representative who has indicated his/her understanding and acceptance.   Dental advisory given  Plan Discussed with: CRNA and Surgeon  Anesthesia Plan Comments:         Anesthesia Quick Evaluation

## 2015-06-08 NOTE — Interval H&P Note (Signed)
History and Physical Interval Note:  06/08/2015 3:05 PM  Terri Chen  has presented today for surgery, with the diagnosis of Left Femoral Neck Fx  The various methods of treatment have been discussed with the patient and family. After consideration of risks, benefits and other options for treatment, the patient has consented to  Procedure(s): CANNULATED HIP PINNING (Left) as a surgical intervention .  The patient's history has been reviewed, patient examined, no change in status, stable for surgery.  I have reviewed the patient's chart and labs.  Questions were answered to the patient's satisfaction.     Kimsey Demaree C

## 2015-06-08 NOTE — Transfer of Care (Signed)
Immediate Anesthesia Transfer of Care Note  Patient: Terri Chen  Procedure(s) Performed: Procedure(s): CANNULATED HIP PINNING (Left)  Patient Location: PACU  Anesthesia Type:General  Level of Consciousness: awake, alert , oriented and patient cooperative  Airway & Oxygen Therapy: Patient Spontanous Breathing and Patient connected to nasal cannula oxygen  Post-op Assessment: Report given to RN and Post -op Vital signs reviewed and stable  Post vital signs: Reviewed and stable  Last Vitals:  Filed Vitals:   06/08/15 0230 06/08/15 0357  BP: 161/85 152/83  Pulse: 101 97  Temp:  36.7 C  Resp: 20 16    Complications: No apparent anesthesia complications

## 2015-06-08 NOTE — Anesthesia Postprocedure Evaluation (Signed)
Anesthesia Post Note  Patient: Terri Chen  Procedure(s) Performed: Procedure(s) (LRB): CANNULATED HIP PINNING (Left)  Patient location during evaluation: PACU Anesthesia Type: General Level of consciousness: awake and alert Pain management: pain level controlled Vital Signs Assessment: post-procedure vital signs reviewed and stable Respiratory status: spontaneous breathing, nonlabored ventilation, respiratory function stable and patient connected to nasal cannula oxygen Cardiovascular status: blood pressure returned to baseline and stable Postop Assessment: no signs of nausea or vomiting Anesthetic complications: no    Last Vitals:  Filed Vitals:   06/08/15 1715 06/08/15 1723  BP:  165/64  Pulse: 72 77  Temp: 36.8 C   Resp: 14 28    Last Pain:  Filed Vitals:   06/08/15 1747  PainSc: 0-No pain                 Reino Kent

## 2015-06-09 ENCOUNTER — Encounter (HOSPITAL_COMMUNITY): Payer: Self-pay | Admitting: Orthopaedic Surgery

## 2015-06-09 DIAGNOSIS — I10 Essential (primary) hypertension: Secondary | ICD-10-CM

## 2015-06-09 LAB — GLUCOSE, CAPILLARY
GLUCOSE-CAPILLARY: 134 mg/dL — AB (ref 65–99)
GLUCOSE-CAPILLARY: 112 mg/dL — AB (ref 65–99)
Glucose-Capillary: 134 mg/dL — ABNORMAL HIGH (ref 65–99)
Glucose-Capillary: 132 mg/dL — ABNORMAL HIGH (ref 65–99)

## 2015-06-09 LAB — CBC
HEMATOCRIT: 40.1 % (ref 36.0–46.0)
Hemoglobin: 13 g/dL (ref 12.0–15.0)
MCH: 30 pg (ref 26.0–34.0)
MCHC: 32.4 g/dL (ref 30.0–36.0)
MCV: 92.6 fL (ref 78.0–100.0)
PLATELETS: 217 10*3/uL (ref 150–400)
RBC: 4.33 MIL/uL (ref 3.87–5.11)
RDW: 13.4 % (ref 11.5–15.5)
WBC: 9.6 10*3/uL (ref 4.0–10.5)

## 2015-06-09 LAB — BASIC METABOLIC PANEL
Anion gap: 10 (ref 5–15)
BUN: 8 mg/dL (ref 6–20)
CHLORIDE: 101 mmol/L (ref 101–111)
CO2: 25 mmol/L (ref 22–32)
CREATININE: 1.19 mg/dL — AB (ref 0.44–1.00)
Calcium: 8.8 mg/dL — ABNORMAL LOW (ref 8.9–10.3)
GFR calc Af Amer: 49 mL/min — ABNORMAL LOW (ref >60)
GFR calc non Af Amer: 43 mL/min — ABNORMAL LOW (ref >60)
GLUCOSE: 144 mg/dL — AB (ref 65–99)
POTASSIUM: 3.8 mmol/L (ref 3.5–5.1)
Sodium: 136 mmol/L (ref 135–145)

## 2015-06-09 LAB — URINALYSIS, ROUTINE W REFLEX MICROSCOPIC
BILIRUBIN URINE: NEGATIVE
Glucose, UA: NEGATIVE mg/dL
Ketones, ur: NEGATIVE mg/dL
NITRITE: NEGATIVE
PROTEIN: 30 mg/dL — AB
SPECIFIC GRAVITY, URINE: 1.016 (ref 1.005–1.030)
pH: 6.5 (ref 5.0–8.0)

## 2015-06-09 LAB — URINE MICROSCOPIC-ADD ON

## 2015-06-09 MED ORDER — HYDROCODONE-ACETAMINOPHEN 5-325 MG PO TABS: 1.0000 | ORAL_TABLET | Freq: Four times a day (QID) | ORAL | Status: DC | PRN

## 2015-06-09 MED ORDER — ASPIRIN EC 325 MG PO TBEC: 325.0000 mg | DELAYED_RELEASE_TABLET | Freq: Every day | ORAL | Status: DC

## 2015-06-09 NOTE — Clinical Social Work Note (Cosign Needed)
Clinical Social Work Assessment  Patient Details  Name: Terri Chen MRN: [id number] Date of Birth: [birth year]  Date of referral:  06/09/15               Reason for consult:  Facility Placement                Permission sought to share information with:  Family Supports, Oceanographer granted to share information::  Yes, Verbal Permission Granted  Name::        Agency::     Relationship::     Contact Information:     Housing/Transportation Living arrangements for the past 2 months:  Single Family Home Source of Information:  Patient Patient Interpreter Needed:  None Criminal Activity/Legal Involvement Pertinent to Current Situation/Hospitalization:  No - Comment as needed Significant Relationships:  Adult Children Christyl Osentoski [phone]) Lives with:  Other (Comment) (grandson) Do you feel safe going back to the place where you live?  Yes Need for family participation in patient care:  No (Coment)  Care giving concerns:  Patient feel at home and fractured her hip. PT recommend SNF   Social Worker assessment / plan:  BSW intern enters patient room. Patient is alert and oriented with daughter near beside. BSW intern explained PT recommendations and referral process. Patient and Patient daughter is agreeable to SNF. They would like for placement to be near where they reside which is  near [facility] and near [place]. Transpiration for patient will be PTAR do to her hip fracture. Patient will not be ready for discharge before Saturday due to her insurance which states you have to have a 3 night stay in the hospital for insurance to approve anything.   Employment status:  Disabled (Comment on whether or not currently receiving Disability) Insurance information:    PT Recommendations:  Skilled Nursing Facility Information / Referral to community resources:  Acute Rehab . Patient/Family's Response to care: Patient daughter and Patient has  good response care to her mom hip fracture recovery   Patient/Family's Understanding of and Emotional Response to Diagnosis, Current Treatment, and Prognosis:  Patient and patient daughter has good insight on patient current condition and current treatment plan.   Emotional Assessment Appearance:  Appears stated age Attitude/Demeanor/Rapport:   (welcoming) Affect (typically observed):  Accepting, Calm Orientation:  Oriented to Place, Oriented to  Time, Oriented to Situation, Oriented to Self Alcohol / Substance use:  Tobacco Use (current smoker) Psych involvement (Current and /or in the community):  No (Comment)  Discharge Needs  Concerns to be addressed:  No discharge needs identified Readmission within the last 30 days:  No Current discharge risk:  None Barriers to Discharge:  No Barriers Identified   Catheryn Bacon  BSW intern  [phone]

## 2015-06-09 NOTE — Progress Notes (Signed)
Orthopedic Tech Progress Note Patient Details:  Terri Chen 21-Nov-1934 [id number]  Patient ID: [name], female   DOB: Jun 29, 1934, 81 y.o.   MRN: [id number]   Saul Fordyce 06/09/2015, 9:07 AMTrapeze bar

## 2015-06-09 NOTE — Progress Notes (Signed)
Occupational Therapy Evaluation Patient Details Name: Cristyn Crossno MRN: [id number] DOB: 1935-02-11 Today's Date: 06/09/2015    History of Present Illness 81 y.o. female with hx of frequent falls, DM, HTN, HLD, COPD, anxiety, prior Fx of left humerus, fell and hurt her left hip.Displaced subcapital Fx of left femoral neck, left fifth rib Fx with subcutaneous air. Patient now s/p ORIF left hip.   Clinical Impression   PTA, pt was independent with ADLs and mobility. Pt currently requires min +2 assist for ADLs and functional transfers due to acute L hip pain and balance deficits as a result of LLE NWB status. Began education on compensatory strategies for ADLs and transfers. Recommending SNF for post-acute rehab stay to maximize independence and safety with ADLs and mobility. Will continue to follow acutely to address OT needs and goals.    Follow Up Recommendations  SNF;Supervision/Assistance - 24 hour    Equipment Recommendations  Other (comment) (TBD in next venue)    Recommendations for Other Services       Precautions / Restrictions Precautions Precautions: Fall Restrictions Weight Bearing Restrictions: Yes LLE Weight Bearing: Non weight bearing      Mobility Bed Mobility Overal bed mobility: Needs Assistance Bed Mobility: Supine to Sit     Supine to sit: Mod assist;HOB elevated     General bed mobility comments: Assist to move LLE off bed, to scoot hips to EOB and for truncal support to come to full sitting position.  Transfers Overall transfer level: Needs assistance Equipment used: 2 person hand held assist Transfers: Sit to/from [employer] Sit to Stand: Min assist;+2 physical assistance Stand pivot transfers: Min assist;+2 physical assistance       General transfer comment: Min +2 assist for boost to stand, to stabilize balance, and to help pt maintain LLE NWB. Verbal and tactile cues for safe hand placement, to maintain LLE NWB, and  safety.    Balance Overall balance assessment: Needs assistance Sitting-balance support: No upper extremity supported;Feet supported Sitting balance-Leahy Scale: Fair     Standing balance support: Bilateral upper extremity supported;During functional activity Standing balance-Leahy Scale: Poor                              ADL Overall ADL's : Needs assistance/impaired                         Toilet Transfer: Minimal assistance;+2 for physical assistance;Stand-pivot;BSC;Cueing for safety;Cueing for sequencing Toilet Transfer Details (indicate cue type and reason): Cues to adhere to NWB LLE status, cues for safe hand placement during transfer Toileting- Clothing Manipulation and Hygiene: Minimal assistance;Sitting/lateral lean Toileting - Clothing Manipulation Details (indicate cue type and reason): Min assist to move gown, pt able to complete front pericare      Functional mobility during ADLs: Minimal assistance;+2 for physical assistance       Vision Vision Assessment?: No apparent visual deficits   Perception     Praxis      Pertinent Vitals/Pain Pain Assessment: Faces Faces Pain Scale: Hurts whole lot Pain Location: L hip Pain Descriptors / Indicators: Grimacing;Guarding Pain Intervention(s): Limited activity within patient's tolerance;Monitored during session;Premedicated before session;Repositioned     Hand Dominance Right   Extremity/Trunk Assessment Upper Extremity Assessment Upper Extremity Assessment: LUE deficits/detail LUE Deficits / Details: hx of recent humerus fx - significantly weaker overall on this side. MD ok'd FWB    Lower Extremity  Assessment Lower Extremity Assessment: Generalized weakness;LLE deficits/detail LLE Deficits / Details: decreased ROM and strength as expected with injury and post op  LLE: Unable to fully assess due to pain   Cervical / Trunk Assessment Cervical / Trunk Assessment: Kyphotic   Communication  Communication Communication: HOH   Cognition Arousal/Alertness: Awake/alert Behavior During Therapy: WFL for tasks assessed/performed Overall Cognitive Status: Within Functional Limits for tasks assessed                     General Comments       Exercises       Shoulder Instructions      Home Living Family/patient expects to be discharged to:: Skilled nursing facility                                 Additional Comments: Currently lives with grandson who is home 24/7. Pt is in the process of moving to ALF      Prior Functioning/Environment               OT Diagnosis: Generalized weakness;Acute pain   OT Problem List: Decreased strength;Decreased range of motion;Decreased activity tolerance;Impaired balance (sitting and/or standing);Decreased coordination;Decreased safety awareness;Decreased knowledge of precautions;Decreased knowledge of use of DME or AE;Pain   OT Treatment/Interventions: Self-care/ADL training;Therapeutic exercise;Energy conservation;DME and/or AE instruction;Therapeutic activities;Patient/family education;Balance training    OT Goals(Current goals can be found in the care plan section) Acute Rehab OT Goals Patient Stated Goal: to get stronger and be independent OT Goal Formulation: With patient Time For Goal Achievement: 06/23/15 Potential to Achieve Goals: Good ADL Goals Pt Will Perform Lower Body Bathing: with min guard assist;sitting/lateral leans;sit to/from stand Pt Will Perform Lower Body Dressing: with min guard assist;sitting/lateral leans;sit to/from stand Pt Will Transfer to Toilet: with min guard assist;stand pivot transfer;bedside commode Pt Will Perform Toileting - Clothing Manipulation and hygiene: with min guard assist;sitting/lateral leans;sit to/from stand  OT Frequency: Min 2X/week   Barriers to D/C:            Co-evaluation PT/OT/SLP Co-Evaluation/Treatment: Yes Reason for Co-Treatment: Complexity of  the patient's impairments (multi-system involvement);For patient/therapist safety   OT goals addressed during session: ADL's and self-care      End of Session Equipment Utilized During Treatment: Rolling walker;Gait belt Nurse Communication: Mobility status;Weight bearing status  Activity Tolerance: Patient tolerated treatment well Patient left: in chair;with call bell/phone within reach;with chair alarm set   Time: 1050-1109 OT Time Calculation (min): 19 min Charges:  OT General Charges $OT Visit: 1 Procedure OT Evaluation $OT Eval Moderate Complexity: 1 Procedure G-Codes:    Nils Pyle, OTR/L Pager: [phone] 06/09/2015, 12:07 PM

## 2015-06-09 NOTE — NC FL2 (Signed)
[place] MEDICAID FL2 LEVEL OF CARE SCREENING TOOL     IDENTIFICATION  Patient Name: Terri Chen Birthdate: 26-Mar-1935 Sex: female Admission Date (Current Location): 06/07/2015  [facility], [place] and [place] Number:  [employer] and Address:  The [place], [address] [place], [place] [address]      Provider Number:    Attending Physician Name and Address:  Jerald Kief, MD  Relative Name and Phone Number:   Rebeka Kimble (daughter))    Current Level of Care: Hospital Recommended Level of Care: Skilled Nursing Facility Prior Approval Number:    Date Approved/Denied:   PASRR Number:    Discharge Plan: SNF    Current Diagnoses: Patient Active Problem List   Diagnosis Date Noted  . DM (diabetes mellitus) (HCC) 06/08/2015  . HTN (hypertension) 06/08/2015  . HLD (hyperlipidemia) 06/08/2015  . Hip fracture (HCC) 06/08/2015  . Closed left hip fracture (HCC)   . Recurrent falls   . Fracture, humerus, proximal 04/07/2015  . Colles' fracture of right radius 09/07/2013    Orientation RESPIRATION BLADDER Height & Weight     Self, Time, Situation, Place  Normal (    Respiratory: normal resp effort, no wheezing) Continent Weight: 145 lb (65.772 kg) Height:   (165.1 cm)  BEHAVIORAL SYMPTOMS/MOOD NEUROLOGICAL BOWEL NUTRITION STATUS     (Negative for headache, lightheadedness and neck stiffness. Negative for weakness, altered level of consciousness , altered mental status, extremity weakness, burning feet, involuntary movement, seizure and syncope. ) Continent Diet (Regular)  AMBULATORY STATUS COMMUNICATION OF NEEDS Skin   Extensive Assist Verbally Normal                       Personal Care Assistance Level of Assistance  Bathing, Feeding, Dressing Bathing Assistance: Maximum assistance Feeding assistance: Limited assistance Dressing Assistance: Maximum assistance     Functional Limitations Info  Sight, Hearing, Speech  Sight Info: Adequate Hearing Info: Adequate Speech Info: Adequate    SPECIAL CARE FACTORS FREQUENCY  PT (By licensed PT), OT (By licensed OT)     PT Frequency: Min 3X/week OT Frequency: Min 2X/week            Contractures      Additional Factors Info  Code Status, Allergies Code Status Info: full Allergies Info: tramadol           Current Medications (06/09/2015):  This is the current hospital active medication list Current Facility-Administered Medications  Medication Dose Route Frequency Provider Last Rate Last Dose  . acetaminophen (TYLENOL) tablet 650 mg  650 mg Oral Q6H PRN Eldred Manges, MD       Or  . acetaminophen (TYLENOL) suppository 650 mg  650 mg Rectal Q6H PRN Eldred Manges, MD      . amLODipine (NORVASC) tablet 5 mg  5 mg Oral Daily Houston Siren, MD   5 mg at 06/09/15 0801  . aspirin EC tablet 81 mg  81 mg Oral Daily Houston Siren, MD   81 mg at 06/09/15 0801  . bisacodyl (DULCOLAX) suppository 10 mg  10 mg Rectal Daily PRN Eldred Manges, MD      . dextrose 5 %-0.9 % sodium chloride infusion   Intravenous Continuous Houston Siren, MD 50 mL/hr at 06/08/15 0413    . docusate sodium (COLACE) capsule 100 mg  100 mg Oral BID Eldred Manges, MD   100 mg at 06/09/15 6045  . enoxaparin (LOVENOX)  injection 40 mg  40 mg Subcutaneous Q24H Eldred Manges, MD   40 mg at 06/09/15 0801  . HYDROcodone-acetaminophen (NORCO/VICODIN) 5-325 MG per tablet 1-2 tablet  1-2 tablet Oral Q6H PRN Eldred Manges, MD   2 tablet at 06/09/15 1055  . insulin aspart (novoLOG) injection 0-9 Units  0-9 Units Subcutaneous TID WC Leda Gauze, NP   1 Units at 06/09/15 1222  . menthol-cetylpyridinium (CEPACOL) lozenge 3 mg  1 lozenge Oral PRN Eldred Manges, MD       Or  . phenol (CHLORASEPTIC) mouth spray 1 spray  1 spray Mouth/Throat PRN Eldred Manges, MD      . metoCLOPramide (REGLAN) tablet 5-10 mg  5-10 mg Oral Q8H PRN Eldred Manges, MD       Or  . metoCLOPramide (REGLAN) injection 5-10 mg  5-10 mg Intravenous  Q8H PRN Eldred Manges, MD      . metoprolol (LOPRESSOR) tablet 100 mg  100 mg Oral Daily Houston Siren, MD   100 mg at 06/09/15 0801  . mometasone-formoterol (DULERA) 200-5 MCG/ACT inhaler 2 puff  2 puff Inhalation BID Houston Siren, MD   2 puff at 06/08/15 0800  . morphine 2 MG/ML injection 1 mg  1 mg Intravenous Q2H PRN Eldred Manges, MD      . ondansetron [facility]) tablet 4 mg  4 mg Oral Q6H PRN Houston Siren, MD       Or  . ondansetron [facility]) injection 4 mg  4 mg Intravenous Q6H PRN Houston Siren, MD   4 mg at 06/09/15 1311  . pravastatin (PRAVACHOL) tablet 40 mg  40 mg Oral QPC supper Houston Siren, MD         Discharge Medications: Please see discharge summary for a list of discharge medications.  Relevant Imaging Results:  Relevant Lab Results:   Additional Information SS# [id number]  Catheryn Bacon  BSW intern  [phone]

## 2015-06-09 NOTE — Progress Notes (Signed)
[facility] PROGRESS NOTE  Terri Chen OZD:[id number] DOB: 09-Mar-1935 DOA: 06/07/2015 PCP: Delorse Lek, MD  HPI/Brief narrative 81 y.o. female with hx of frequent falls, DM, HTN, HLD, COPD, anxiety, prior Fx of left humerus, fell and hurt her left hip. X ray in the ER showed slightly displaced subcapital Fx of left femoral neck. CXR was clear but showed left fifth rib Fx with subcutaneous air, but no pneumothorax, and X ray of the left elbow showed no Fx. Head CT was negative. She denied HA, LOC, palpitation, fever, chills, CP or SOB. EDP spoke with Dr Ophelia Charter of orthopedics, who recommended transfer to [facility] for ORIF. Hospitalist was asked to admit her for same.  Assessment/Plan: 1. L hip fracture 1. Orthopedic Surgery was consulted 2. Patient underwent reduction and cannulated screw fixation to the L femoral neck fracture on 3/1 3. Continue pain control as tolerated 2. L rib fracture 1. Mildly displaced fracture of L lateral 5th rib 2. Stable at present 3. Continue analgesics as tolerated 3. Hypokalemia 1. Replaced 2. Cont to follow lytes and correct as needed 4. HTN 1. BP remains stable at present 2. Cont current regimen 5. HLD 1. Currently on statin 6. DVT prophylaxis 1. Heparin subQ  Code Status: Full Family Communication: Pt in room Disposition Plan: Plan SNF in possible 24-48hrs   Consultants:  Orthopedic Surgery  Procedures:    Antibiotics: Anti-infectives    None      HPI/Subjective: No complaints this AM.  Objective: Filed Vitals:   06/08/15 1957 06/09/15 0018 06/09/15 0429 06/09/15 1225  BP: 143/74 140/74 130/71 123/62  Pulse: 78 70 72 69  Temp: 97.9 F (36.6 C) 97.6 F (36.4 C) 97.7 F (36.5 C) 97.7 F (36.5 C)  TempSrc:  Oral Oral   Resp: Height:      Weight:      SpO2: 94% 98% 100% 93%    Intake/Output Summary (Last 24 hours) at 06/09/15 1534 Last data filed at 06/09/15 0700  Gross per 24 hour  Intake    1510 ml  Output   1250 ml  Net    260 ml   Filed Weights   06/07/15 2151  Weight: 65.772 kg (145 lb)    Exam:   General:  Awake, in nad, laying in bed  Cardiovascular: regular, s1, s2  Respiratory: normal resp effort, no wheezing  Abdomen: soft, nondistended, pos BS  Musculoskeletal: perfused, no clubbing   Data Reviewed: Basic Metabolic Panel:  Recent Labs Lab 06/07/15 2324 06/08/15 0646 06/08/15 1920 06/09/15 0310  NA 140  --   --  136  K 3.1*  --   --  3.8  CL 102  --   --  101  CO2 27  --   --  25  GLUCOSE 188*  --   --  144*  BUN 12  --   --  8  CREATININE 1.30* 1.20* 1.21* 1.19*  CALCIUM 9.2  --   --  8.8*   Liver Function Tests: No results for input(s): AST, ALT, ALKPHOS, BILITOT, PROT, ALBUMIN in the last 168 hours. No results for input(s): LIPASE, AMYLASE in the last 168 hours. No results for input(s): AMMONIA in the last 168 hours. CBC:  Recent Labs Lab 06/07/15 2324 06/08/15 0646 06/08/15 1920 06/09/15 0310  WBC 15.7* 11.7* 11.2* 9.6  NEUTROABS 13.4*  --   --   --   HGB 14.3 14.0 12.9 13.0  HCT 42.3 42.3 39.3  40.1  MCV 92.6 91.8 92.5 92.6  PLT 264 254 231 217   Cardiac Enzymes: No results for input(s): CKTOTAL, CKMB, CKMBINDEX, TROPONINI in the last 168 hours. BNP (last 3 results) No results for input(s): BNP in the last 8760 hours.  ProBNP (last 3 results) No results for input(s): PROBNP in the last 8760 hours.  CBG:  Recent Labs Lab 06/08/15 1423 06/08/15 1744 06/08/15 2110 06/09/15 0622 06/09/15 1107  GLUCAP 108* 112* 239* 134* 134*    No results found for this or any previous visit (from the past 240 hour(s)).   Studies: Dg Chest 1 View  06/07/2015  CLINICAL DATA:  Pt states she has fallen 4 times in the last few days. Pt states she fell tonight getting out of her chair and has had Lt hip pain and Lt elbow pain since. Abrasions to posterior Lt elbow. Hx diabetes, HTN, COPD, ORIF LEFT HUMERUS FRACTURE 04/07/2015, smoker  EXAM: CHEST 1 VIEW COMPARISON:  None. FINDINGS: There is a fracture of the lateral left fifth rib, mildly displaced approximately 1/2 shaft width. There is subcutaneous air in chest lateral and inferior to this, but no evidence of a pneumothorax. No other convincing fracture. Lungs are mildly hyperexpanded. There is mild linear scarring or atelectasis in the medial left lung base and minor upper lobe scarring. No evidence of pneumonia or pulmonary edema. No pleural effusion. Cardiac silhouette is normal in size. No mediastinal or hilar masses. IMPRESSION: 1. Mildly displaced fracture of the left lateral fifth rib. Although there is a small amount of adjacent subcutaneous air, there is no evidence of a pneumothorax. 2. No other acute findings. Electronically Signed   By: Amie Portland M.D.   On: 06/07/2015 23:11   Dg Elbow Complete Left  06/07/2015  CLINICAL DATA:  Status post fall 4 times, with left elbow pain. Initial encounter. EXAM: LEFT ELBOW - COMPLETE 3+ VIEW COMPARISON:  None. FINDINGS: There is no evidence of fracture or dislocation. There appears to be some degree of chronic deformity of the distal humerus. The visualized joint spaces are preserved. No significant joint effusion is identified. The soft tissues are unremarkable in appearance. IMPRESSION: No evidence of acute fracture or dislocation. Underlying apparent chronic deformity of the distal humerus. Electronically Signed   By: Roanna Raider M.D.   On: 06/07/2015 22:50   Ct Head Wo Contrast  06/07/2015  CLINICAL DATA:  Multiple recent falls, with generalized weakness. Initial encounter. EXAM: CT HEAD WITHOUT CONTRAST TECHNIQUE: Contiguous axial images were obtained from the base of the skull through the vertex without intravenous contrast. COMPARISON:  None. FINDINGS: There is no evidence of acute infarction, mass lesion, or intra- or extra-axial hemorrhage on CT. Prominence of ventricles and sulci reflects mild cortical volume loss.  Scattered periventricular and subcortical white matter change likely reflects small vessel ischemic microangiopathy. Cerebellar atrophy is noted. The brainstem and fourth ventricle are within normal limits. The basal ganglia are unremarkable in appearance. The cerebral hemispheres demonstrate grossly normal gray-white differentiation. No mass effect or midline shift is seen. There is no evidence of fracture; visualized osseous structures are unremarkable in appearance. The visualized portions of the orbits are within normal limits. The paranasal sinuses and mastoid air cells are well-aerated. No significant soft tissue abnormalities are seen. IMPRESSION: 1. No acute intracranial pathology seen on CT. 2. Mild cortical volume loss and scattered small vessel ischemic microangiopathy. Electronically Signed   By: Roanna Raider M.D.   On: 06/07/2015 23:59   Dg Hip  Operative Unilat W Or W/o Pelvis Left  06/08/2015  CLINICAL DATA:  ORIF left femoral neck fracture EXAM: OPERATIVE LEFT HIP (WITH PELVIS IF PERFORMED) 2 VIEWS TECHNIQUE: Fluoroscopic spot image(s) were submitted for interpretation post-operatively. COMPARISON:  06/07/2015 left hip radiographs FINDINGS: Fluoroscopy time 1 minutes 11 seconds. Two nondiagnostic spot fluoroscopic radiographs of the left hip demonstrate 3 pins transfixing a subcapital left femoral neck fracture. IMPRESSION: Intraoperative fluoroscopic guidance for ORIF left femoral neck fracture. Electronically Signed   By: Delbert Phenix M.D.   On: 06/08/2015 17:15   Dg Hip Unilat With Pelvis 2-3 Views Left  06/07/2015  CLINICAL DATA:  Status post falls, with left hip pain. Initial encounter. EXAM: DG HIP (WITH OR WITHOUT PELVIS) 2-3V LEFT COMPARISON:  None. FINDINGS: There is a minimally displaced subcapital fracture of the left femoral neck. The left femoral head remains seated at the acetabulum. The right hip joint is unremarkable. No significant degenerative change is appreciated. The  sacroiliac joints are unremarkable in appearance. The visualized bowel gas pattern is grossly unremarkable in appearance. Scattered phleboliths are noted within the pelvis. Diffuse vascular calcifications are seen. IMPRESSION: 1. Minimally displaced subcapital fracture of the left femoral neck. 2. Diffuse vascular calcifications seen. Electronically Signed   By: Roanna Raider M.D.   On: 06/07/2015 22:46    Scheduled Meds: . amLODipine  5 mg Oral Daily  . aspirin EC  81 mg Oral Daily  . docusate sodium  100 mg Oral BID  . enoxaparin (LOVENOX) injection  40 mg Subcutaneous Q24H  . insulin aspart  0-9 Units Subcutaneous TID WC  . metoprolol  100 mg Oral Daily  . mometasone-formoterol  2 puff Inhalation BID  . pravastatin  40 mg Oral QPC supper   Continuous Infusions: . dextrose 5 % and 0.9% NaCl 50 mL/hr at 06/08/15 0413    Active Problems:   Fracture, humerus, proximal   DM (diabetes mellitus) (HCC)   HTN (hypertension)   HLD (hyperlipidemia)   Hip fracture (HCC)   Closed left hip fracture (HCC)   Recurrent falls  Yukie Bergeron K  [facility] Pager [phone]. If 7PM-7AM, please contact night-coverage at [email or web address], password [facility] 06/09/2015, 3:34 PM  LOS: 1 day

## 2015-06-09 NOTE — Progress Notes (Signed)
Subjective: 1 Day Post-Op Procedure(s) (LRB): CANNULATED HIP PINNING (Left) Patient reports pain as mild.    Objective: Vital signs in last 24 hours: Temp:  [97.6 F (36.4 C)-98.2 F (36.8 C)] 97.7 F (36.5 C) (03/02 0429) Pulse Rate:  [67-83] 72 (03/02 0429) Resp:  [10-28] 16 (03/02 0429) BP: (127-165)/(62-74) 130/71 mmHg (03/02 0429) SpO2:  [85 %-100 %] 100 % (03/02 0429)  Intake/Output from previous day: 03/01 0701 - 03/02 0700 In: 1270 [P.O.:120; I.V.:900; IV Piggyback:250] Out: 1250 [Urine:1200; Blood:50] Intake/Output this shift: Total I/O In: -  Out: 500 [Urine:500]   Recent Labs  06/07/15 2324 06/08/15 0646 06/08/15 1920 06/09/15 0310  HGB 14.3 14.0 12.9 13.0    Recent Labs  06/08/15 1920 06/09/15 0310  WBC 11.2* 9.6  RBC 4.25 4.33  HCT 39.3 40.1  PLT 231 217    Recent Labs  06/07/15 2324  06/08/15 1920 06/09/15 0310  NA 140  --   --  136  K 3.1*  --   --  3.8  CL 102  --   --  101  CO2 27  --   --  25  BUN 12  --   --  8  CREATININE 1.30*  < > 1.21* 1.19*  GLUCOSE 188*  --   --  144*  CALCIUM 9.2  --   --  8.8*  < > = values in this interval not displayed.  Recent Labs  06/07/15 2324  INR 1.02    Neurologically intact  Assessment/Plan: 1 Day Post-Op Procedure(s) (LRB): CANNULATED HIP PINNING (Left) Discharge to SNF. Will be NWB for at least 6 wks. Hx of falls. Lives in [place]. Can work on transfers to recliner while here at hospital.  Has UE weakness and post ORIF left prox humerus which is healed and OK for FWB both  UE.    Will be NWB left LE times 6 wks YATES,MARK C 06/09/2015, 6:49 AM

## 2015-06-09 NOTE — Evaluation (Signed)
Physical Therapy Evaluation Patient Details Name: Terri Chen MRN: [id number] DOB: 1934-05-14 Today's Date: 06/09/2015   History of Present Illness  81 y.o. female with hx of frequent falls, DM, HTN, HLD, COPD, anxiety, prior Fx of left humerus, fell and hurt her left hip.Displaced subcapital Fx of left femoral neck, left fifth rib Fx with subcutaneous air. Patient now s/p ORIF left hip.  Clinical Impression  Patient demonstrates deficits in functional mobility as indicated below. Will need continued skilled PT to address deficits and maximize function. Will see as indicated and progress as tolerated. Will need SNF as pt with history of falls, and is transfers only NWBing LLE.    Follow Up Recommendations SNF;Supervision/Assistance - 24 hour    Equipment Recommendations   (TBD next venue)    Recommendations for Other Services       Precautions / Restrictions Precautions Precautions: Fall Precaution Comments: TRANSFERS ONLY Restrictions Weight Bearing Restrictions: Yes LLE Weight Bearing: Non weight bearing      Mobility  Bed Mobility Overal bed mobility: Needs Assistance Bed Mobility: Supine to Sit     Supine to sit: Mod assist;HOB elevated     General bed mobility comments: Assist to move LLE off bed, to scoot hips to EOB and for truncal support to come to full sitting position.  Transfers Overall transfer level: Needs assistance Equipment used: 2 person hand held assist Transfers: Sit to/from [employer] Sit to Stand: Min assist;+2 physical assistance Stand pivot transfers: Min assist;+2 physical assistance       General transfer comment: Min +2 assist for boost to stand, to stabilize balance, and to help pt maintain LLE NWB. Verbal and tactile cues for safe hand placement, to maintain LLE NWB, and safety.  Ambulation/Gait                Stairs            Wheelchair Mobility    Modified Rankin (Stroke Patients Only)       Balance Overall balance assessment: Needs assistance Sitting-balance support: No upper extremity supported;Feet supported Sitting balance-Leahy Scale: Fair     Standing balance support: Bilateral upper extremity supported;During functional activity Standing balance-Leahy Scale: Poor                               Pertinent Vitals/Pain Pain Assessment: Faces Faces Pain Scale: Hurts whole lot Pain Location: L hip Pain Descriptors / Indicators: Grimacing;Guarding Pain Intervention(s): Limited activity within patient's tolerance;Monitored during session;Premedicated before session;Repositioned    Home Living Family/patient expects to be discharged to:: Skilled nursing facility                 Additional Comments: Currently lives with grandson who is home 24/7. Pt is in the process of moving to ALF    Prior Function                 Hand Dominance   Dominant Hand: Right    Extremity/Trunk Assessment   Upper Extremity Assessment: LUE deficits/detail       LUE Deficits / Details: hx of recent humerus fx - significantly weaker overall on this side. MD ok'd FWB    Lower Extremity Assessment: Generalized weakness;LLE deficits/detail   LLE Deficits / Details: decreased ROM and strength as expected with injury and post op   Cervical / Trunk Assessment: Kyphotic  Communication   Communication: HOH  Cognition Arousal/Alertness: Awake/alert Behavior During Therapy:  WFL for tasks assessed/performed Overall Cognitive Status: No family/caregiver present to determine baseline cognitive functioning Area of Impairment: Following commands;Safety/judgement;Awareness;Problem solving     Memory: Decreased recall of precautions Following Commands: Follows one step commands consistently;Follows one step commands with increased time Safety/Judgement: Decreased awareness of safety;Decreased awareness of deficits Awareness: Emergent Problem Solving: Slow  processing;Decreased initiation;Difficulty sequencing;Requires verbal cues;Requires tactile cues      General Comments      Exercises        Assessment/Plan    PT Assessment Patient needs continued PT services  PT Diagnosis Difficulty walking;Generalized weakness;Acute pain   PT Problem List Decreased strength;Decreased range of motion;Decreased activity tolerance;Decreased balance;Decreased mobility;Decreased knowledge of use of DME;Decreased safety awareness;Pain  PT Treatment Interventions DME instruction;Functional mobility training;Therapeutic activities;Therapeutic exercise;Balance training;Patient/family education;Wheelchair mobility training   PT Goals (Current goals can be found in the Care Plan section) Acute Rehab PT Goals Patient Stated Goal: to get stronger and be independent PT Goal Formulation: With patient Time For Goal Achievement: 06/23/15 Potential to Achieve Goals: Good    Frequency Min 3X/week   Barriers to discharge        Co-evaluation PT/OT/SLP Co-Evaluation/Treatment: Yes Reason for Co-Treatment: Complexity of the patient's impairments (multi-system involvement);For patient/therapist safety PT goals addressed during session: Mobility/safety with mobility OT goals addressed during session: ADL's and self-care       End of Session Equipment Utilized During Treatment: Gait belt Activity Tolerance: Patient limited by fatigue;Patient limited by pain Patient left: in chair;with call bell/phone within reach;with chair alarm set Nurse Communication: Mobility status         Time: 2130-8657 PT Time Calculation (min) (ACUTE ONLY): 17 min   Charges:   PT Evaluation $PT Eval Moderate Complexity: 1 Procedure     PT G CodesFabio Asa 06/14/15, 1:24 PM Charlotte Crumb, PT DPT  [phone]

## 2015-06-10 LAB — BASIC METABOLIC PANEL
Anion gap: 11 (ref 5–15)
BUN: 11 mg/dL (ref 6–20)
CALCIUM: 8.7 mg/dL — AB (ref 8.9–10.3)
CO2: 26 mmol/L (ref 22–32)
CREATININE: 1.31 mg/dL — AB (ref 0.44–1.00)
Chloride: 99 mmol/L — ABNORMAL LOW (ref 101–111)
GFR calc non Af Amer: 38 mL/min — ABNORMAL LOW (ref >60)
GFR, EST AFRICAN AMERICAN: 44 mL/min — AB (ref >60)
GLUCOSE: 106 mg/dL — AB (ref 65–99)
Potassium: 3.8 mmol/L (ref 3.5–5.1)
SODIUM: 136 mmol/L (ref 135–145)

## 2015-06-10 LAB — GLUCOSE, CAPILLARY
GLUCOSE-CAPILLARY: 94 mg/dL (ref 65–99)
GLUCOSE-CAPILLARY: 126 mg/dL — AB (ref 65–99)
Glucose-Capillary: 93 mg/dL (ref 65–99)
Glucose-Capillary: 104 mg/dL — ABNORMAL HIGH (ref 65–99)

## 2015-06-10 LAB — CBC
HEMATOCRIT: 36.8 % (ref 36.0–46.0)
HEMOGLOBIN: 12.6 g/dL (ref 12.0–15.0)
MCH: 31.7 pg (ref 26.0–34.0)
MCHC: 34.2 g/dL (ref 30.0–36.0)
MCV: 92.5 fL (ref 78.0–100.0)
Platelets: 220 10*3/uL (ref 150–400)
RBC: 3.98 MIL/uL (ref 3.87–5.11)
RDW: 13.3 % (ref 11.5–15.5)
WBC: 7.7 10*3/uL (ref 4.0–10.5)

## 2015-06-10 NOTE — Progress Notes (Signed)
Subjective: 2 Days Post-Op Procedure(s) (LRB): CANNULATED HIP PINNING (Left) Patient reports pain as mild.    Objective: Vital signs in last 24 hours: Temp:  [97.7 F (36.5 C)-97.8 F (36.6 C)] 97.8 F (36.6 C) (03/03 0518) Pulse Rate:  [69-73] 73 (03/03 0518) Resp:  [16] 16 (03/03 0518) BP: (123-153)/(62-89) 153/63 mmHg (03/03 0518) SpO2:  [93 %-94 %] 93 % (03/03 0518)  Intake/Output from previous day: 03/02 0701 - 03/03 0700 In: 120 [P.O.:120] Out: -  Intake/Output this shift:     Recent Labs  06/07/15 2324 06/08/15 0646 06/08/15 1920 06/09/15 0310 06/10/15 0625  HGB 14.3 14.0 12.9 13.0 12.6    Recent Labs  06/09/15 0310 06/10/15 0625  WBC 9.6 7.7  RBC 4.33 3.98  HCT 40.1 36.8  PLT 217 220    Recent Labs  06/09/15 0310 06/10/15 0625  NA 136 136  K 3.8 3.8  CL 101 99*  CO2 25 26  BUN 8 11  CREATININE 1.19* 1.31*  GLUCOSE 144* 106*  CALCIUM 8.8* 8.7*    Recent Labs  06/07/15 2324  INR 1.02    Neurologically intact  Assessment/Plan: 2 Days Post-Op Procedure(s) (LRB): CANNULATED HIP PINNING (Left) Discharge to SNF when ready.   Damico Partin C 06/10/2015, 9:30 AM

## 2015-06-10 NOTE — Clinical Social Work Note (Signed)
[facility] received: [phone] Deedra Ehrich  Gina Lister Brizzi, LCSW [phone]  5N1-9; 2S 15-16 and Hospital Psychiatric Service Line Licensed Clinical Social Worker

## 2015-06-10 NOTE — Progress Notes (Signed)
CSW provided pt with bed offers- pt chooses [facility]  [name] able [facility] pt over the weekend- CSW will need to call [phone] and speak with Terri Chen (the charge RN) concerning DC  CSW will continue to follow  Terri Chen, [facility] LL[facility]SWA Clinical Social Worker [phone]

## 2015-06-10 NOTE — Progress Notes (Signed)
[facility] PROGRESS NOTE  Terri Chen ZOX:[id number] DOB: [birth year] DOA: 06/07/2015 PCP: Delorse LekBURNETT,BRENT A, MD  HPI/Brief narrative 81 y.o. female with hx of frequent falls, DM, HTN, HLD, COPD, anxiety, prior Fx of left humerus, fell and hurt her left hip. X ray in the ER showed slightly displaced subcapital Fx of left femoral neck. CXR was clear but showed left fifth rib Fx with subcutaneous air, but no pneumothorax, and X ray of the left elbow showed no Fx. Head CT was negative. She denied HA, LOC, palpitation, fever, chills, CP or SOB. EDP spoke with Dr Ophelia CharterYates of orthopedics, who [facility]. Hospitalist was asked to admit her for same.  Assessment/Plan: 1. L hip fracture 1. Orthopedic Surgery was consulted 2. Patient underwent reduction and cannulated screw fixation to the L femoral neck fracture on 3/1 3. Continue pain control as tolerated 2. L rib fracture 1. Mildly displaced fracture of L lateral 5th rib 2. Stable at present 3. Continue analgesics as tolerated 3. Hypokalemia 1. Replaced 2. Cont to follow lytes and correct as needed 4. HTN 1. BP remains stable 2. Cont current regimen 5. HLD 1. Cont on statin 6. DVT prophylaxis 1. Heparin subQ while admitted  Code Status: Full Family Communication: Pt in room Disposition Plan: Plan SNF in possible 24-48hrs   Consultants:  Orthopedic Surgery  Procedures:    Antibiotics: Anti-infectives    None      HPI/Subjective: Patient is without complaints, states pain is controlled  Objective: Filed Vitals:   06/09/15 0429 06/09/15 1225 06/09/15 2222 06/10/15 0518  BP: 130/71 123/62 141/89 153/63  Pulse: 72 69 71 73  Temp: 97.7 F (36.5 C) 97.7 F (36.5 C) 97.7 F (36.5 C) 97.8 F (36.6 C)  TempSrc: Oral  Oral Oral  Resp: 16 16 16 16   Height:      Weight:      SpO2: 100% 93% 94% 93%    Intake/Output Summary (Last 24 hours) at 06/10/15 1559 Last data filed at 06/10/15  0800  Gross per 24 hour  Intake    120 ml  Output    200 ml  Net    -80 ml   Filed Weights   06/07/15 2151  Weight: 65.772 kg (145 lb)    Exam:   General:  Awake, in nad, laying in bed  Cardiovascular: regular, s1, s2  Respiratory: normal resp effort, no wheezing  Abdomen: soft, nondistended, pos BS  Musculoskeletal: perfused, no clubbing, no cyanosis  Data Reviewed: Basic Metabolic Panel:  Recent Labs Lab 06/07/15 2324 06/08/15 0646 06/08/15 1920 06/09/15 0310 06/10/15 0625  NA 140  --   --  136 136  Chen 3.1*  --   --  3.8 3.8  CL 102  --   --  101 99*  CO2 27  --   --  25 26  GLUCOSE 188*  --   --  144* 106*  BUN 12  --   --  8 11  CREATININE 1.30* 1.20* 1.21* 1.19* 1.31*  CALCIUM 9.2  --   --  8.8* 8.7*   Liver Function Tests: No results for input(s): AST, ALT, ALKPHOS, BILITOT, PROT, ALBUMIN in the last 168 hours. No results for input(s): LIPASE, AMYLASE in the last 168 hours. No results for input(s): AMMONIA in the last 168 hours. CBC:  Recent Labs Lab 06/07/15 2324 06/08/15 0646 06/08/15 1920 06/09/15 0310 06/10/15 0625  WBC 15.7* 11.7* 11.2* 9.6 7.7  NEUTROABS 13.4*  --   --   --   --  HGB 14.3 14.0 12.9 13.0 12.6  HCT 42.3 42.3 39.3 40.1 36.8  MCV 92.6 91.8 92.5 92.6 92.5  PLT 264 254 231 217 220   Cardiac Enzymes: No results for input(s): CKTOTAL, CKMB, CKMBINDEX, TROPONINI in the last 168 hours. BNP (last 3 results) No results for input(s): BNP in the last 8760 hours.  ProBNP (last 3 results) No results for input(s): PROBNP in the last 8760 hours.  CBG:  Recent Labs Lab 06/09/15 1107 06/09/15 1609 06/09/15 2226 06/10/15 0630 06/10/15 1159  GLUCAP 134* 132* 112* 93 94    No results found for this or any previous visit (from the past 240 hour(s)).   Studies: Dg Hip Operative Unilat W Or W/o Pelvis Left  06/08/2015  CLINICAL DATA:  ORIF left femoral neck fracture EXAM: OPERATIVE LEFT HIP (WITH PELVIS IF PERFORMED) 2 VIEWS  TECHNIQUE: Fluoroscopic spot image(s) were submitted for interpretation post-operatively. COMPARISON:  06/07/2015 left hip radiographs FINDINGS: Fluoroscopy time 1 minutes 11 seconds. Two nondiagnostic spot fluoroscopic radiographs of the left hip demonstrate 3 pins transfixing a subcapital left femoral neck fracture. IMPRESSION: Intraoperative fluoroscopic guidance for ORIF left femoral neck fracture. Electronically Signed   By: Delbert Phenix M.D.   On: 06/08/2015 17:15    Scheduled Meds: . amLODipine  5 mg Oral Daily  . aspirin EC  81 mg Oral Daily  . docusate sodium  100 mg Oral BID  . enoxaparin (LOVENOX) injection  40 mg Subcutaneous Q24H  . insulin aspart  0-9 Units Subcutaneous TID WC  . metoprolol  100 mg Oral Daily  . mometasone-formoterol  2 puff Inhalation BID  . pravastatin  40 mg Oral QPC supper   Continuous Infusions: . dextrose 5 % and 0.9% NaCl 50 mL/hr at 06/08/15 0413    Active Problems:   Fracture, humerus, proximal   DM (diabetes mellitus) (HCC)   HTN (hypertension)   HLD (hyperlipidemia)   Hip fracture (HCC)   Closed left hip fracture (HCC)   Recurrent falls  Terri Chen  [facility] Pager [phone]. If 7PM-7AM, please contact night-coverage at [email or web address], password [facility] 06/10/2015, 3:59 PM  LOS: 2 days

## 2015-06-10 NOTE — Care Management Important Message (Signed)
Important Message  Patient Details  Name: Terri Chen MRN: [id number] Date of Birth: Mar 05, 1935   Medicare Important Message Given:  Yes    Alexius Hangartner P Jaxton Casale 06/10/2015, 4:10 PM

## 2015-06-11 LAB — BASIC METABOLIC PANEL
Anion gap: 12 (ref 5–15)
BUN: 15 mg/dL (ref 6–20)
CALCIUM: 9 mg/dL (ref 8.9–10.3)
CO2: 23 mmol/L (ref 22–32)
CREATININE: 1.27 mg/dL — AB (ref 0.44–1.00)
Chloride: 100 mmol/L — ABNORMAL LOW (ref 101–111)
GFR, EST AFRICAN AMERICAN: 46 mL/min — AB (ref >60)
GFR, EST NON AFRICAN AMERICAN: 39 mL/min — AB (ref >60)
GLUCOSE: 112 mg/dL — AB (ref 65–99)
Potassium: 3.8 mmol/L (ref 3.5–5.1)
SODIUM: 135 mmol/L (ref 135–145)

## 2015-06-11 LAB — CBC
HCT: 36.9 % (ref 36.0–46.0)
HEMOGLOBIN: 12.2 g/dL (ref 12.0–15.0)
MCH: 30.3 pg (ref 26.0–34.0)
MCHC: 33.1 g/dL (ref 30.0–36.0)
MCV: 91.6 fL (ref 78.0–100.0)
PLATELETS: 248 10*3/uL (ref 150–400)
RBC: 4.03 MIL/uL (ref 3.87–5.11)
RDW: 13.2 % (ref 11.5–15.5)
WBC: 8 10*3/uL (ref 4.0–10.5)

## 2015-06-11 LAB — GLUCOSE, CAPILLARY
Glucose-Capillary: 111 mg/dL — ABNORMAL HIGH (ref 65–99)
Glucose-Capillary: 102 mg/dL — ABNORMAL HIGH (ref 65–99)

## 2015-06-11 NOTE — Progress Notes (Signed)
Clinical Social Work  [facility]. CSW informed pt, dtr, and grandson at bedside who is agreeable to DC and desires PTAR. CSW left PTAR forms and hard scripts on chart and arranged for transportation. RN to call report.  CSW is signing off but available if needed.  Unk LightningHolly Diyan Dave, [place] Weekend Coverage [phone]

## 2015-06-11 NOTE — Progress Notes (Signed)
[facility] by way of [facility].

## 2015-06-11 NOTE — Progress Notes (Signed)
[facility]

## 2015-06-11 NOTE — Progress Notes (Signed)
Subjective: 3 Days Post-Op Procedure(s) (LRB): CANNULATED HIP PINNING (Left) Patient reports pain as mild.    Objective: Vital signs in last 24 hours: Temp:  [97.1 F (36.2 Chen)-97.9 F (36.6 Chen)] 97.9 F (36.6 Chen) (03/04 0534) Pulse Rate:  [68-87] 87 (03/04 0848) Resp:  [18] 18 (03/04 0534) BP: (134-160)/(55-91) 134/55 mmHg (03/04 0848) SpO2:  [95 %-98 %] 96 % (03/04 0848)  Intake/Output from previous day: 03/03 0701 - 03/04 0700 In: 480 [P.O.:480] Out: 200 [Urine:200] Intake/Output this shift:     Recent Labs  06/08/15 1920 06/09/15 0310 06/10/15 0625 06/11/15 0351  HGB 12.9 13.0 12.6 12.2    Recent Labs  06/10/15 0625 06/11/15 0351  WBC 7.7 8.0  RBC 3.98 4.03  HCT 36.8 36.9  PLT 220 248    Recent Labs  06/10/15 0625 06/11/15 0351  NA 136 135  K 3.8 3.8  CL 99* 100*  CO2 26 23  BUN 11 15  CREATININE 1.31* 1.27*  GLUCOSE 106* 112*  CALCIUM 8.7* 9.0   No results for input(s): LABPT, INR in the last 72 hours.  Neurologically intact  Assessment/Plan: 3 Days Post-Op Procedure(s) (LRB): CANNULATED HIP PINNING (Left) Discharge to SNF  Terri Chen 06/11/2015, 9:01 AM

## 2015-06-11 NOTE — Clinical Social Work Placement (Signed)
   CLINICAL SOCIAL WORK PLACEMENT  NOTE  Date:  06/11/2015  Patient Details  Name: Terri Chen K Demarinis MRN: [id number] Date of Birth: 1934/05/30  Clinical Social Work is seeking post-discharge placement for this patient at the Skilled  Nursing Facility level of care (*CSW will initial, date and re-position this form in  chart as items are completed):  Yes   Patient/family provided with [facility] Clinical Social Work Department's list of facilities offering this level of care within the geographic area requested by the patient (or if unable, by the patient's family).  Yes   Patient/family informed of their freedom to choose among providers that offer the needed level of care, that participate in Medicare, Medicaid or managed care program needed by the patient, have an available bed and are willing to accept the patient.  Yes   Patient/family informed of [facility]'s ownership interest in [facility] and [facility], as well as of the fact that they are under no obligation to receive care at these facilities.  PASRR submitted to EDS on       PASRR number received on 06/10/15     Existing PASRR number confirmed on       FL2 transmitted to all facilities in geographic area requested by pt/family on 06/10/15     FL2 transmitted to all facilities within larger geographic area on       Patient informed that his/her managed care company has contracts with or will negotiate with certain facilities, including the following:        Yes   Patient/family informed of bed offers received.  [facility]     Physician recommends and patient chooses bed at      [facility].  Patient to be transferred to facility by [facility]     Patient family notified on 06/11/15 of transfer.  Name of family member notified:  Dtr and grandson at bedside     PHYSICIAN       Additional Comment:     _______________________________________________ Marnee SpringGerber, Zehra Rucci Marie, LCSW 06/11/2015, 12:49 PM

## 2015-06-11 NOTE — Discharge Summary (Signed)
Physician Discharge Summary  Terri Chen EAV:[id number] DOB: [birth year] DOA: 06/07/2015  PCP: Delorse Lek, MD  Admit date: 06/07/2015 Discharge date: 06/11/2015  Time spent: 20 minutes  Recommendations for Outpatient Follow-up:  1. Follow up with PCP in 1-2 weeks 2. Follow up with Orthopedic Surgery as scheduled   Discharge Diagnoses:  Active Problems:   Fracture, humerus, proximal   DM (diabetes mellitus) (HCC)   HTN (hypertension)   HLD (hyperlipidemia)   Hip fracture (HCC)   Closed left hip fracture (HCC)   Recurrent falls   Discharge Condition: Improved  Diet recommendation: Diabetic, heart healthy  Filed Weights   06/07/15 2151  Weight: 65.772 kg (145 lb)    History of present illness:  Please review dictated H and P from 2/28 for details. Briefly, 81 y.o. female with hx of frequent falls, DM, HTN, HLD, COPD, anxiety, prior Fx of left humerus, fell and hurt her left hip. X ray in the ER showed slightly displaced subcapital Fx of left femoral neck. CXR was clear but showed left fifth rib Fx with subcutaneous air, but no pneumothorax, and X ray of the left elbow showed no Fx. Head CT was negative. She denied HA, LOC, palpitation, fever, chills, CP or SOB. EDP spoke with Dr Ophelia Charter of orthopedics, who recommended transfer to [facility] for ORIF. Hospitalist was asked to admit her for same  Hospital Course:  1. L hip fracture 1. Orthopedic Surgery was consulted 2. Patient underwent reduction and cannulated screw fixation to the L femoral neck fracture on 3/1 3. Continued pain control as tolerated 2. L rib fracture 1. Mildly displaced fracture of L lateral 5th rib 2. Stable at present 3. Continue analgesics as tolerated 3. Hypokalemia 1. Replaced 4. HTN 1. BP remains stable 2. Cont current regimen 5. HLD 1. Cont on statin 6. DVT prophylaxis 1. Heparin subQ while admitted  Procedures: reduction and cannulated screw fixation to the L femoral neck fracture on  3/1  Consultations:  Orthopedic Surgery  Discharge Exam: Filed Vitals:   06/10/15 1500 06/10/15 2155 06/11/15 0534 06/11/15 0848  BP: 142/72 152/91 160/80 134/55  Pulse: 68 70 81 87  Temp: 97.1 F (36.2 C) 97.6 F (36.4 C) 97.9 F (36.6 C)   TempSrc: Oral Oral Oral   Resp: Height:      Weight:      SpO2: 95% 96% 98% 96%    General: Awake, in nad Cardiovascular: regular, s1, s2 Respiratory: normal resp effort, no wheezing  Discharge Instructions     Medication List    TAKE these medications        amLODipine 5 MG tablet  Commonly known as:  NORVASC  Take 5 mg by mouth daily with breakfast.     aspirin EC 325 MG tablet  Take 1 tablet (325 mg total) by mouth daily.     budesonide-formoterol 160-4.5 MCG/ACT inhaler  Commonly known as:  SYMBICORT  Inhale 2 puffs into the lungs 2 (two) times daily as needed. For shortness of breath     hydrochlorothiazide 25 MG tablet  Commonly known as:  HYDRODIURIL  Take 25 mg by mouth daily with breakfast.     HYDROcodone-acetaminophen 5-325 MG tablet  Commonly known as:  NORCO  Take 1 tablet by mouth every 6 (six) hours as needed for moderate pain.     metoprolol 100 MG tablet  Commonly known as:  LOPRESSOR  Take 100 mg by mouth daily with breakfast.  pravastatin 40 MG tablet  Commonly known as:  PRAVACHOL  Take 40 mg by mouth daily after supper.       Allergies  Allergen Reactions  . Tramadol Nausea And Vomiting   Follow-up Information    Schedule an appointment as soon as possible for a visit with Eldred Manges, MD.   Specialty:  Orthopedic Surgery   Why:  needs return office visit 2 weeks postop   Contact information:   [address] [place] [address] [phone]       Follow up with Delorse Lek, MD.   Specialty:  Family Medicine   Contact information:   [address] [place] [address] [phone]        The results of significant diagnostics from  this hospitalization (including imaging, microbiology, ancillary and laboratory) are listed below for reference.    Significant Diagnostic Studies: Dg Chest 1 View  06/07/2015  CLINICAL DATA:  Pt states she has fallen 4 times in the last few days. Pt states she fell tonight getting out of her chair and has had Lt hip pain and Lt elbow pain since. Abrasions to posterior Lt elbow. Hx diabetes, HTN, COPD, ORIF LEFT HUMERUS FRACTURE 04/07/2015, smoker EXAM: CHEST 1 VIEW COMPARISON:  None. FINDINGS: There is a fracture of the lateral left fifth rib, mildly displaced approximately 1/2 shaft width. There is subcutaneous air in chest lateral and inferior to this, but no evidence of a pneumothorax. No other convincing fracture. Lungs are mildly hyperexpanded. There is mild linear scarring or atelectasis in the medial left lung base and minor upper lobe scarring. No evidence of pneumonia or pulmonary edema. No pleural effusion. Cardiac silhouette is normal in size. No mediastinal or hilar masses. IMPRESSION: 1. Mildly displaced fracture of the left lateral fifth rib. Although there is a small amount of adjacent subcutaneous air, there is no evidence of a pneumothorax. 2. No other acute findings. Electronically Signed   By: Amie Portland M.D.   On: 06/07/2015 23:11   Dg Elbow Complete Left  06/07/2015  CLINICAL DATA:  Status post fall 4 times, with left elbow pain. Initial encounter. EXAM: LEFT ELBOW - COMPLETE 3+ VIEW COMPARISON:  None. FINDINGS: There is no evidence of fracture or dislocation. There appears to be some degree of chronic deformity of the distal humerus. The visualized joint spaces are preserved. No significant joint effusion is identified. The soft tissues are unremarkable in appearance. IMPRESSION: No evidence of acute fracture or dislocation. Underlying apparent chronic deformity of the distal humerus. Electronically Signed   By: Roanna Raider M.D.   On: 06/07/2015 22:50   Ct Head Wo  Contrast  06/07/2015  CLINICAL DATA:  Multiple recent falls, with generalized weakness. Initial encounter. EXAM: CT HEAD WITHOUT CONTRAST TECHNIQUE: Contiguous axial images were obtained from the base of the skull through the vertex without intravenous contrast. COMPARISON:  None. FINDINGS: There is no evidence of acute infarction, mass lesion, or intra- or extra-axial hemorrhage on CT. Prominence of ventricles and sulci reflects mild cortical volume loss. Scattered periventricular and subcortical white matter change likely reflects small vessel ischemic microangiopathy. Cerebellar atrophy is noted. The brainstem and fourth ventricle are within normal limits. The basal ganglia are unremarkable in appearance. The cerebral hemispheres demonstrate grossly normal gray-white differentiation. No mass effect or midline shift is seen. There is no evidence of fracture; visualized osseous structures are unremarkable in appearance. The visualized portions of the orbits are within normal limits. The paranasal  sinuses and mastoid air cells are well-aerated. No significant soft tissue abnormalities are seen. IMPRESSION: 1. No acute intracranial pathology seen on CT. 2. Mild cortical volume loss and scattered small vessel ischemic microangiopathy. Electronically Signed   By: Roanna RaiderJeffery  Chang M.D.   On: 06/07/2015 23:59   Dg Hip Operative Unilat W Or W/o Pelvis Left  06/08/2015  CLINICAL DATA:  ORIF left femoral neck fracture EXAM: OPERATIVE LEFT HIP (WITH PELVIS IF PERFORMED) 2 VIEWS TECHNIQUE: Fluoroscopic spot image(s) were submitted for interpretation post-operatively. COMPARISON:  06/07/2015 left hip radiographs FINDINGS: Fluoroscopy time 1 minutes 11 seconds. Two nondiagnostic spot fluoroscopic radiographs of the left hip demonstrate 3 pins transfixing a subcapital left femoral neck fracture. IMPRESSION: Intraoperative fluoroscopic guidance for ORIF left femoral neck fracture. Electronically Signed   By: Delbert PhenixJason A Poff M.D.    On: 06/08/2015 17:15   Dg Hip Unilat With Pelvis 2-3 Views Left  06/07/2015  CLINICAL DATA:  Status post falls, with left hip pain. Initial encounter. EXAM: DG HIP (WITH OR WITHOUT PELVIS) 2-3V LEFT COMPARISON:  None. FINDINGS: There is a minimally displaced subcapital fracture of the left femoral neck. The left femoral head remains seated at the acetabulum. The right hip joint is unremarkable. No significant degenerative change is appreciated. The sacroiliac joints are unremarkable in appearance. The visualized bowel gas pattern is grossly unremarkable in appearance. Scattered phleboliths are noted within the pelvis. Diffuse vascular calcifications are seen. IMPRESSION: 1. Minimally displaced subcapital fracture of the left femoral neck. 2. Diffuse vascular calcifications seen. Electronically Signed   By: Roanna RaiderJeffery  Chang M.D.   On: 06/07/2015 22:46    Microbiology: No results found for this or any previous visit (from the past 240 hour(s)).   Labs: Basic Metabolic Panel:  Recent Labs Lab 06/07/15 2324 06/08/15 0646 06/08/15 1920 06/09/15 0310 06/10/15 0625 06/11/15 0351  NA 140  --   --  136 136 135  K 3.1*  --   --  3.8 3.8 3.8  CL 102  --   --  101 99* 100*  CO2 27  --   --  25 26 23   GLUCOSE 188*  --   --  144* 106* 112*  BUN 12  --   --  8 11 15   CREATININE 1.30* 1.20* 1.21* 1.19* 1.31* 1.27*  CALCIUM 9.2  --   --  8.8* 8.7* 9.0   Liver Function Tests: No results for input(s): AST, ALT, ALKPHOS, BILITOT, PROT, ALBUMIN in the last 168 hours. No results for input(s): LIPASE, AMYLASE in the last 168 hours. No results for input(s): AMMONIA in the last 168 hours. CBC:  Recent Labs Lab 06/07/15 2324 06/08/15 0646 06/08/15 1920 06/09/15 0310 06/10/15 0625 06/11/15 0351  WBC 15.7* 11.7* 11.2* 9.6 7.7 8.0  NEUTROABS 13.4*  --   --   --   --   --   HGB 14.3 14.0 12.9 13.0 12.6 12.2  HCT 42.3 42.3 39.3 40.1 36.8 36.9  MCV 92.6 91.8 92.5 92.6 92.5 91.6  PLT 264 254 231 217 220  248   Cardiac Enzymes: No results for input(s): CKTOTAL, CKMB, CKMBINDEX, TROPONINI in the last 168 hours. BNP: BNP (last 3 results) No results for input(s): BNP in the last 8760 hours.  ProBNP (last 3 results) No results for input(s): PROBNP in the last 8760 hours.  CBG:  Recent Labs Lab 06/10/15 0630 06/10/15 1159 06/10/15 1655 06/10/15 2305 06/11/15 0608  GLUCAP 93 94 126* 104* 111*    Signed:  Tesla Keeler K  [facility] 06/11/2015, 11:40 AM

## 2016-04-27 ENCOUNTER — Emergency Department (HOSPITAL_COMMUNITY): Payer: Medicare Other

## 2016-04-27 ENCOUNTER — Emergency Department (HOSPITAL_COMMUNITY)
Admission: EM | Admit: 2016-04-27 | Discharge: 2016-04-27 | Disposition: A | Discharge status: Home / Self Care | Payer: Medicare Other | Attending: Emergency Medicine | Admitting: Emergency Medicine

## 2016-04-27 ENCOUNTER — Encounter (HOSPITAL_COMMUNITY): Payer: Self-pay | Admitting: Emergency Medicine

## 2016-04-27 DIAGNOSIS — F17210 Nicotine dependence, cigarettes, uncomplicated: Secondary | ICD-10-CM | POA: Diagnosis not present

## 2016-04-27 DIAGNOSIS — Y939 Activity, unspecified: Secondary | ICD-10-CM | POA: Insufficient documentation

## 2016-04-27 DIAGNOSIS — S22080A Wedge compression fracture of T11-T12 vertebra, initial encounter for closed fracture: Secondary | ICD-10-CM | POA: Diagnosis not present

## 2016-04-27 DIAGNOSIS — J449 Chronic obstructive pulmonary disease, unspecified: Secondary | ICD-10-CM | POA: Diagnosis not present

## 2016-04-27 DIAGNOSIS — M25551 Pain in right hip: Secondary | ICD-10-CM | POA: Diagnosis not present

## 2016-04-27 DIAGNOSIS — Z7982 Long term (current) use of aspirin: Secondary | ICD-10-CM | POA: Insufficient documentation

## 2016-04-27 DIAGNOSIS — E119 Type 2 diabetes mellitus without complications: Secondary | ICD-10-CM | POA: Diagnosis not present

## 2016-04-27 DIAGNOSIS — Z79899 Other long term (current) drug therapy: Secondary | ICD-10-CM | POA: Insufficient documentation

## 2016-04-27 DIAGNOSIS — Y92009 Unspecified place in unspecified non-institutional (private) residence as the place of occurrence of the external cause: Secondary | ICD-10-CM | POA: Insufficient documentation

## 2016-04-27 DIAGNOSIS — Y999 Unspecified external cause status: Secondary | ICD-10-CM | POA: Diagnosis not present

## 2016-04-27 DIAGNOSIS — W010XXA Fall on same level from slipping, tripping and stumbling without subsequent striking against object, initial encounter: Secondary | ICD-10-CM | POA: Insufficient documentation

## 2016-04-27 DIAGNOSIS — Z791 Long term (current) use of non-steroidal anti-inflammatories (NSAID): Secondary | ICD-10-CM | POA: Insufficient documentation

## 2016-04-27 DIAGNOSIS — I10 Essential (primary) hypertension: Secondary | ICD-10-CM | POA: Diagnosis not present

## 2016-04-27 DIAGNOSIS — S299XXA Unspecified injury of thorax, initial encounter: Secondary | ICD-10-CM | POA: Diagnosis present

## 2016-04-27 MED ORDER — HYDROCODONE-ACETAMINOPHEN 5-325 MG PO TABS
1.0000 | ORAL_TABLET | ORAL | Status: AC
  Administered 2016-04-27: 1 via ORAL
  Filled 2016-04-27: qty 1

## 2016-04-27 MED ORDER — HYDROCODONE-ACETAMINOPHEN 5-325 MG PO TABS: 1.0000 | ORAL_TABLET | Freq: Four times a day (QID) | ORAL | 0 refills | Status: DC | PRN

## 2016-04-27 NOTE — Discharge Instructions (Signed)
Wear the brace for comfort, follow up with an orthopedic or neurosurgery spine doctor to make sure you are healing properly and to discuss any additional therapy

## 2016-04-27 NOTE — ED Provider Notes (Signed)
[facility]-EMERGENCY DEPT Provider Note   CSN: [id number] Arrival date & time: 04/27/16  1128     History   Chief Complaint CC: Fall, pain  HPI Terri Chen is a 82 y.o. female.  HPI Patient presents to emergency room with complaints of pain in the lower back and hip.  Patient fell today after tripping over a carpet at home. She landed on her back and right hip. Patient now has pain in her lower back and her right hip. It hurts for her to walk. She normally has to use a walker and a wheelchair .  She had to use the wheelchair to get into the ED today.  No LOC.  No CP or SOB.  No numbness or weakness.  Past Medical History:  Diagnosis Date  . 1979    left side weakness  . Anxiety   . COPD (chronic obstructive pulmonary disease) (HCC)   . Diabetes mellitus   . Diabetes mellitus without complication (HCC)   . High cholesterol   . Hyperlipidemia   . Hypertension   . Hypertension   . Pneumonia     Patient Active Problem List   Diagnosis Date Noted  . DM (diabetes mellitus) (HCC) 06/08/2015  . HTN (hypertension) 06/08/2015  . HLD (hyperlipidemia) 06/08/2015  . Hip fracture (HCC) 06/08/2015  . Closed left hip fracture (HCC)   . Recurrent falls   . Fracture, humerus, proximal 04/07/2015  . Colles' fracture of right radius 09/07/2013    Past Surgical History:  Procedure Laterality Date  . ABDOMINAL HYSTERECTOMY    . COLONOSCOPY    . EYE SURGERY Bilateral    cataract surgery with lens implant  . HIP PINNING,CANNULATED Left 06/08/2015   Procedure: CANNULATED HIP PINNING;  Surgeon: Eldred MangesMark C Yates, MD;  Location: [facility] OR;  Service: Orthopedics;  Laterality: Left;  . KNEE SURGERY Right    arthroscopic  . LUNG SURGERY    . lung surgery    . ORIF HUMERUS FRACTURE Left 04/07/2015  . ORIF HUMERUS FRACTURE Left 04/07/2015   Procedure: OPEN REDUCTION INTERNAL FIXATION (ORIF) PROXIMAL HUMERUS FRACTURE;  Surgeon: Eldred MangesMark C Yates, MD;  Location: [facility] OR;  Service: Orthopedics;  Laterality:  Left;  . TONSILLECTOMY      OB History    Gravida Para Term Preterm AB Living   0 0 0 0 0     SAB TAB Ectopic Multiple Live Births   0 0 0           Home Medications    Prior to Admission medications   Medication Sig Start Date End Date Taking? Authorizing Provider  aspirin EC 81 MG tablet Take 81 mg by mouth daily.   Yes Historical Provider, MD  budesonide-formoterol (SYMBICORT) 160-4.5 MCG/ACT inhaler Inhale 2 puffs into the lungs 2 (two) times daily as needed. For shortness of breath   Yes Historical Provider, MD  clonazePAM (KLONOPIN) 0.5 MG tablet Take 0.5 mg by mouth at bedtime as needed. For sleep 04/12/16  Yes Historical Provider, MD  ibuprofen (ADVIL,MOTRIN) 200 MG tablet Take 200 mg by mouth daily as needed for moderate pain.   Yes Historical Provider, MD  metoprolol (LOPRESSOR) 100 MG tablet Take 100 mg by mouth daily with breakfast.  07/23/13  Yes Historical Provider, MD  pravastatin (PRAVACHOL) 40 MG tablet Take 40 mg by mouth daily after supper.    Yes Historical Provider, MD  amLODipine (NORVASC) 5 MG tablet Take 5 mg by mouth daily with breakfast.  Historical Provider, MD  HYDROcodone-acetaminophen (NORCO/VICODIN) 5-325 MG tablet Take 1 tablet by mouth every 6 (six) hours as needed. 04/27/16   Linwood Dibbles, MD    Family History Family History  Problem Relation Age of Onset  . Diabetes Mother   . COPD Mother   . Heart attack Father     Social History Social History  Substance Use Topics  . Smoking status: Current Every Day Smoker    Packs/day: 0.50    Years: 60.00    Types: Cigarettes  . Smokeless tobacco: Never Used  . Alcohol use No     Allergies   Cefprozil; Erythromycin base; Nitrofurantoin; and Tramadol   Review of Systems Review of Systems  All other systems reviewed and are negative.    Physical Exam Updated Vital Signs BP 163/76   Pulse (!) 55   Temp 97.5 F (36.4 C) (Oral)   Resp 18   Ht 5\' 5"  (1.651 m)   Wt 65.8 kg   SpO2 92%    BMI 24.13 kg/m   Physical Exam  Constitutional: No distress.  HENT:  Head: Normocephalic and atraumatic.  Right Ear: External ear normal.  Left Ear: External ear normal.  Eyes: Conjunctivae are normal. Right eye exhibits no discharge. Left eye exhibits no discharge. No scleral icterus.  Neck: Neck supple. No tracheal deviation present.  Cardiovascular: Normal rate, regular rhythm and intact distal pulses.   Pulmonary/Chest: Effort normal and breath sounds normal. No stridor. No respiratory distress. She has no wheezes. She has no rales.  Abdominal: Soft. Bowel sounds are normal. She exhibits no distension. There is no tenderness. There is no rebound and no guarding.  Musculoskeletal: She exhibits tenderness. She exhibits no edema.       Right hip: She exhibits tenderness and bony tenderness.       Cervical back: Normal.       Thoracic back: Normal.       Lumbar back: She exhibits tenderness and bony tenderness.  No shortening of the extremity, able to lift the right leg at the hip off the bed without pain, no distal numbness or weakness  Neurological: She is alert. She has normal strength. No cranial nerve deficit (no facial droop, extraocular movements intact, no slurred speech) or sensory deficit. She exhibits normal muscle tone. She displays no seizure activity. Coordination normal.  Skin: Skin is warm and dry. No rash noted.  Psychiatric: She has a normal mood and affect.  Nursing note and vitals reviewed.    ED Treatments / Results    Radiology Dg Lumbar Spine Complete  Result Date: 04/27/2016 CLINICAL DATA:  Low back pain and right hip pain since a fall at home today. EXAM: LUMBAR SPINE - COMPLETE 4+ VIEW COMPARISON:  Chest x-ray dated 06/07/2015 FINDINGS: There is a slight compression fracture of the superior endplate of T12, age indeterminate. There are only 4 typical lumbar segments in the lumbar spine with complete sacralization of the L5 segment. There is extensive aortic  atherosclerosis. IMPRESSION: 1. Compression fracture of the superior endplate of T12, age indeterminate. 2. Extensive aortic atherosclerosis. 3. No acute abnormality of the lumbar spine. Electronically Signed   By: Francene Boyers M.D.   On: 04/27/2016 17:11   Dg Hip Unilat With Pelvis 2-3 Views Right  Result Date: 04/27/2016 CLINICAL DATA:  Right hip pain secondary to a fall at home today. EXAM: DG HIP (WITH OR WITHOUT PELVIS) 2-3V RIGHT COMPARISON:  Radiograph dated 06/07/2015 FINDINGS: There is no evidence of hip  fracture or dislocation. There is no evidence of arthropathy or other focal bone abnormality. Osteopenia. Aortic atherosclerosis. IMPRESSION: No acute bone abnormality.  Osteopenia. Aortic atherosclerosis. Electronically Signed   By: Francene Boyers M.D.   On: 04/27/2016 17:22    Procedures Procedures (including critical care time)  Medications Ordered in ED Medications  HYDROcodone-acetaminophen (NORCO/VICODIN) 5-325 MG per tablet 1 tablet (1 tablet Oral Given 04/27/16 1636)     Initial Impression / Assessment and Plan / ED Course  I have reviewed the triage vital signs and the nursing notes.  Pertinent labs & imaging results that were available during my care of the patient were reviewed by me and considered in my medical decision making (see chart for details).    Patient's x-rays suggest a T12 compression fracture of the superior endplate. Patient does complain of pain in the lumbar region. It is certainly possible the T12 area is the cause of her pain. Plan on discharge home with oral pain medications. I've ordered a TLSO brace for comfort. Discussed follow-up with an orthopedic surgeon or neurosurgeon  Final Clinical Impressions(s) / ED Diagnoses   Final diagnoses:  Closed wedge compression fracture of twelfth thoracic vertebra, initial encounter (HCC)    New Prescriptions New Prescriptions   HYDROCODONE-ACETAMINOPHEN (NORCO/VICODIN) 5-325 MG TABLET    Take 1 tablet by  mouth every 6 (six) hours as needed.     Linwood Dibbles, MD 04/27/16 [phone]

## 2016-04-27 NOTE — ED Triage Notes (Signed)
Patient states she tripped over carpet at home and fell on back. Complains of lower back pain and right hip pain. Denies hitting head or LOC. NAD.

## 2016-04-27 NOTE — ED Notes (Signed)
Reita ClicheBobby [phone] from [employer] will come to ED to fit pt for TLSO brace. Expected to arrive in approximately 1 hour.

## 2016-04-27 NOTE — ED Notes (Signed)
Production assistant, [employer] [facility] [facility] [employer] [phone] called. Awaiting return call.

## 2016-04-30 ENCOUNTER — Encounter (HOSPITAL_COMMUNITY): Payer: Self-pay | Admitting: Emergency Medicine

## 2016-04-30 ENCOUNTER — Emergency Department (HOSPITAL_COMMUNITY): Payer: Medicare Other

## 2016-04-30 ENCOUNTER — Emergency Department (HOSPITAL_COMMUNITY)
Admission: EM | Admit: 2016-04-30 | Discharge: 2016-04-30 | Disposition: A | Discharge status: Home / Self Care | Payer: Medicare Other | Attending: Emergency Medicine | Admitting: Emergency Medicine

## 2016-04-30 DIAGNOSIS — R55 Syncope and collapse: Secondary | ICD-10-CM | POA: Insufficient documentation

## 2016-04-30 DIAGNOSIS — F17210 Nicotine dependence, cigarettes, uncomplicated: Secondary | ICD-10-CM | POA: Diagnosis not present

## 2016-04-30 DIAGNOSIS — Z7982 Long term (current) use of aspirin: Secondary | ICD-10-CM | POA: Insufficient documentation

## 2016-04-30 DIAGNOSIS — I10 Essential (primary) hypertension: Secondary | ICD-10-CM | POA: Diagnosis not present

## 2016-04-30 DIAGNOSIS — W19XXXA Unspecified fall, initial encounter: Secondary | ICD-10-CM

## 2016-04-30 DIAGNOSIS — R1010 Upper abdominal pain, unspecified: Secondary | ICD-10-CM | POA: Insufficient documentation

## 2016-04-30 DIAGNOSIS — K5900 Constipation, unspecified: Secondary | ICD-10-CM | POA: Insufficient documentation

## 2016-04-30 DIAGNOSIS — S3992XD Unspecified injury of lower back, subsequent encounter: Secondary | ICD-10-CM | POA: Diagnosis present

## 2016-04-30 DIAGNOSIS — E119 Type 2 diabetes mellitus without complications: Secondary | ICD-10-CM | POA: Insufficient documentation

## 2016-04-30 DIAGNOSIS — S32010D Wedge compression fracture of first lumbar vertebra, subsequent encounter for fracture with routine healing: Secondary | ICD-10-CM | POA: Insufficient documentation

## 2016-04-30 DIAGNOSIS — J449 Chronic obstructive pulmonary disease, unspecified: Secondary | ICD-10-CM | POA: Insufficient documentation

## 2016-04-30 DIAGNOSIS — W010XXD Fall on same level from slipping, tripping and stumbling without subsequent striking against object, subsequent encounter: Secondary | ICD-10-CM | POA: Diagnosis not present

## 2016-04-30 DIAGNOSIS — Z79899 Other long term (current) drug therapy: Secondary | ICD-10-CM | POA: Diagnosis not present

## 2016-04-30 LAB — CBC WITH DIFFERENTIAL/PLATELET
BASOS ABS: 0 10*3/uL (ref 0.0–0.1)
BASOS PCT: 0 %
Eosinophils Absolute: 0 10*3/uL (ref 0.0–0.7)
Eosinophils Relative: 0 %
HEMATOCRIT: 43.3 % (ref 36.0–46.0)
Hemoglobin: 14.8 g/dL (ref 12.0–15.0)
LYMPHS PCT: 39 %
Lymphs Abs: 4.7 10*3/uL — ABNORMAL HIGH (ref 0.7–4.0)
MCH: 31.4 pg (ref 26.0–34.0)
MCHC: 34.2 g/dL (ref 30.0–36.0)
MCV: 91.9 fL (ref 78.0–100.0)
MONO ABS: 0.7 10*3/uL (ref 0.1–1.0)
Monocytes Relative: 6 %
NEUTROS ABS: 6.5 10*3/uL (ref 1.7–7.7)
Neutrophils Relative %: 55 %
Platelets: 237 10*3/uL (ref 150–400)
RBC: 4.71 MIL/uL (ref 3.87–5.11)
RDW: 13.3 % (ref 11.5–15.5)
WBC: 11.9 10*3/uL — AB (ref 4.0–10.5)

## 2016-04-30 LAB — COMPREHENSIVE METABOLIC PANEL
ALT: 11 U/L — AB (ref 14–54)
ANION GAP: 11 (ref 5–15)
AST: 20 U/L (ref 15–41)
Albumin: 3.5 g/dL (ref 3.5–5.0)
Alkaline Phosphatase: 76 U/L (ref 38–126)
BUN: 21 mg/dL — ABNORMAL HIGH (ref 6–20)
CHLORIDE: 100 mmol/L — AB (ref 101–111)
CO2: 24 mmol/L (ref 22–32)
CREATININE: 1.68 mg/dL — AB (ref 0.44–1.00)
Calcium: 9.3 mg/dL (ref 8.9–10.3)
GFR, EST AFRICAN AMERICAN: 32 mL/min — AB (ref >60)
GFR, EST NON AFRICAN AMERICAN: 28 mL/min — AB (ref >60)
Glucose, Bld: 158 mg/dL — ABNORMAL HIGH (ref 65–99)
POTASSIUM: 3.6 mmol/L (ref 3.5–5.1)
SODIUM: 135 mmol/L (ref 135–145)
Total Bilirubin: 0.7 mg/dL (ref 0.3–1.2)
Total Protein: 7 g/dL (ref 6.5–8.1)

## 2016-04-30 LAB — LIPASE, BLOOD: LIPASE: 22 U/L (ref 11–51)

## 2016-04-30 LAB — CBG MONITORING, ED: GLUCOSE-CAPILLARY: 144 mg/dL — AB (ref 65–99)

## 2016-04-30 LAB — TROPONIN I

## 2016-04-30 MED ORDER — HYDROCODONE-ACETAMINOPHEN 5-325 MG PO TABS: 1.0000 | ORAL_TABLET | Freq: Four times a day (QID) | ORAL | 0 refills | Status: DC | PRN

## 2016-04-30 NOTE — ED Notes (Signed)
Patient transported to CT 

## 2016-04-30 NOTE — Discharge Instructions (Signed)
Hydrocodone as prescribed as needed for pain.  All with your primary Dr. if you're not improving in the next week.

## 2016-04-30 NOTE — ED Notes (Signed)
Pt had large BM in bedpan.   

## 2016-04-30 NOTE — ED Triage Notes (Signed)
Pt c/o continued pain and constipation since falling on Tuesday of last week. Pt was seen in ED on Friday. Currently wearing TLSO brace. Denies any new injury. Pt has not been seen by orthopedic doctor.

## 2016-04-30 NOTE — ED Notes (Signed)
Daughter reports that pt was in BR in waiting room straining to have BM and that is when she became unresponsive and and diaphoretic. Brought out of BR and nurses notified

## 2016-04-30 NOTE — ED Provider Notes (Signed)
[facility]EM[facility]Y DEPT Provider Note   CSN: [id number] Arrival date & time: 04/30/16  1108  By signing my name below, I, Majel Homer, attest that this documentation has been prepared under the direction and in the presence of Geoffery Lyons, MD . Electronically Signed: Majel Homer, Scribe. 04/30/2016. 12:09 PM.  History   Chief Complaint Chief Complaint  Patient presents with  . Fall   The history is provided by the patient and a relative. No language interpreter was used.   HPI Comments: Sheretha Shadd is a 82 y.o. female with PMHx of DM, HTN, and HLD, who presents to the Emergency Department for an evaluation of persistent, back pain and generalized abdominal pain that began on 04/27/16. Per daughter, pt was seen at [facility] ED on 04/27/16 after "tripping over her carpet at home" and falling onto her lower back and right hip. She denies any head injury or loss of conscioussness after her fall. She notes pt was diagnosed with a closed wedge compression fracture and was given a TLSO brace. Pt reports she is still experiencing persistent, back pain from this fall and has not followed up with an orthopedic specialist. Per daughter, pt has also been complaining of generalized abdominal pain and constipation in which she has not had a BM since her fall. She notes she went to the bathroom to try and have a BM while in the waiting room today when she suddenly began to experience diaphoresis. Pt denies any new injury since her fall.   Past Medical History:  Diagnosis Date  . 1979    left side weakness  . Anxiety   . COPD (chronic obstructive pulmonary disease) (HCC)   . Diabetes mellitus   . Diabetes mellitus without complication (HCC)   . High cholesterol   . Hyperlipidemia   . Hypertension   . Hypertension   . Pneumonia    Patient Active Problem List   Diagnosis Date Noted  . DM (diabetes mellitus) (HCC) 06/08/2015  . HTN (hypertension) 06/08/2015  . HLD (hyperlipidemia) 06/08/2015  . Hip  fracture (HCC) 06/08/2015  . Closed left hip fracture (HCC)   . Recurrent falls   . Fracture, humerus, proximal 04/07/2015  . Colles' fracture of right radius 09/07/2013   Past Surgical History:  Procedure Laterality Date  . ABDOMINAL HYSTERECTOMY    . COLONOSCOPY    . EYE SURGERY Bilateral    cataract surgery with lens implant  . HIP PINNING,CANNULATED Left 06/08/2015   Procedure: CANNULATED HIP PINNING;  Surgeon: Eldred Manges, MD;  Location: [facility] OR;  Service: Orthopedics;  Laterality: Left;  . KNEE SURGERY Right    arthroscopic  . LUNG SURGERY    . lung surgery    . ORIF HUMERUS FRACTURE Left 04/07/2015  . ORIF HUMERUS FRACTURE Left 04/07/2015   Procedure: OPEN REDUCTION INTERNAL FIXATION (ORIF) PROXIMAL HUMERUS FRACTURE;  Surgeon: Eldred Manges, MD;  Location: [facility] OR;  Service: Orthopedics;  Laterality: Left;  . TONSILLECTOMY     OB History    Gravida Para Term Preterm AB Living   0 0 0 0 0     SAB TAB Ectopic Multiple Live Births   0 0 0         Home Medications    Prior to Admission medications   Medication Sig Start Date End Date Taking? Authorizing Provider  amLODipine (NORVASC) 5 MG tablet Take 5 mg by mouth daily with breakfast.     Historical Provider, MD  aspirin EC 81  MG tablet Take 81 mg by mouth daily.    Historical Provider, MD  budesonide-formoterol (SYMBICORT) 160-4.5 MCG/ACT inhaler Inhale 2 puffs into the lungs 2 (two) times daily as needed. For shortness of breath    Historical Provider, MD  clonazePAM (KLONOPIN) 0.5 MG tablet Take 0.5 mg by mouth at bedtime as needed. For sleep 04/12/16   Historical Provider, MD  HYDROcodone-acetaminophen (NORCO/VICODIN) 5-325 MG tablet Take 1 tablet by mouth every 6 (six) hours as needed. 04/27/16   Linwood DibblesJon Knapp, MD  ibuprofen (ADVIL,MOTRIN) 200 MG tablet Take 200 mg by mouth daily as needed for moderate pain.    Historical Provider, MD  metoprolol (LOPRESSOR) 100 MG tablet Take 100 mg by mouth daily with breakfast.  07/23/13    Historical Provider, MD  pravastatin (PRAVACHOL) 40 MG tablet Take 40 mg by mouth daily after supper.     Historical Provider, MD    Family History Family History  Problem Relation Age of Onset  . Diabetes Mother   . COPD Mother   . Heart attack Father     Social History Social History  Substance Use Topics  . Smoking status: Current Every Day Smoker    Packs/day: 0.50    Years: 60.00    Types: Cigarettes  . Smokeless tobacco: Never Used  . Alcohol use No   Allergies   Cefprozil; Erythromycin base; Nitrofurantoin; and Tramadol  Review of Systems Review of Systems  Gastrointestinal: Positive for abdominal pain and constipation.  Musculoskeletal: Positive for back pain.  Neurological: Negative for syncope.   Physical Exam Updated Vital Signs BP 149/56 (BP Location: Right Arm)   Pulse (!) 59   Temp 97.8 F (36.6 C) (Oral)   Resp 16   Ht 5\' 5"  (1.651 m)   Wt 145 lb (65.8 kg)   SpO2 95%   BMI 24.13 kg/m   Physical Exam  Constitutional: She is oriented to person, place, and time. She appears well-developed and well-nourished. No distress.  Pt appears pale   HENT:  Head: Normocephalic and atraumatic.  Eyes: EOM are normal.  Neck: Normal range of motion.  Cardiovascular: Normal rate, regular rhythm and normal heart sounds.   Pulmonary/Chest: Effort normal and breath sounds normal.  Abdominal: Soft. She exhibits no distension. There is tenderness.  Mild tenderness across the upper abdomen   Musculoskeletal: Normal range of motion.  Neurological: She is alert and oriented to person, place, and time.  Skin: Skin is warm and dry.  Psychiatric: She has a normal mood and affect. Judgment normal.  Nursing note and vitals reviewed.  ED Treatments / Results  Labs (all labs ordered are listed, but only abnormal results are displayed) Labs Reviewed  CBG MONITORING, ED - Abnormal; Notable for the following:       Result Value   Glucose-Capillary 144 (*)    All other  components within normal limits    EKG  EKG Interpretation None      Radiology No results found.  Procedures Procedures (including critical care time)  Medications Ordered in ED Medications - No data to display  DIAGNOSTIC STUDIES:  Oxygen Saturation is 95% on RA, normal by my interpretation.    COORDINATION OF CARE:  12:02 PM Discussed treatment plan with pt at bedside and pt agreed to plan.  Initial Impression / Assessment and Plan / ED Course  I have reviewed the triage vital signs and the nursing notes.  Pertinent labs & imaging results that were available during my care of the  patient were reviewed by me and considered in my medical decision making (see chart for details).  Asian is a 82 year old female recently diagnosed with a lumbar compression fracture. She presents today for evaluation of ongoing pain and constipation. She denies any new injury or trauma. She denies any radiation of her pain into her legs, weakness, or bladder complaints. Her workup today is essentially unremarkable. Laboratory studies are reassuring, vital signs are stable, and CT scan fails to reveal any acute intra-abdominal process. It showed the compression fracture that was previously identified.  While in the waiting room, the patient was attempting have a bowel movement when she had some sort of dizzy episode. She became lightheaded, then lowered herself to the floor. She did not completely lose consciousness. She was then brought back to the exam room and immediately evaluated by me. Later on in her emergency department course, she had a large bowel movement and is now feeling better. I suspect the episode in the waiting room with some sort of vasovagal episode related to straining. I see nothing else in the workup to indicate anything else.  She will be given pain medication for her compression fracture and is to follow-up with her primary doctor.  I personally performed the services described  in this documentation, which was scribed in my presence. The recorded information has been reviewed and is accurate.      Final Clinical Impressions(s) / ED Diagnoses   Final diagnoses:  None    New Prescriptions New Prescriptions   No medications on file     Geoffery Lyons, MD 04/30/16 1427

## 2016-12-22 ENCOUNTER — Emergency Department (HOSPITAL_COMMUNITY)
Admission: EM | Admit: 2016-12-22 | Discharge: 2016-12-22 | Disposition: A | Discharge status: Home / Self Care | Payer: Medicare Other | Attending: Emergency Medicine | Admitting: Emergency Medicine

## 2016-12-22 ENCOUNTER — Encounter (HOSPITAL_COMMUNITY): Payer: Self-pay | Admitting: Cardiology

## 2016-12-22 ENCOUNTER — Emergency Department (HOSPITAL_COMMUNITY): Payer: Medicare Other

## 2016-12-22 DIAGNOSIS — F17210 Nicotine dependence, cigarettes, uncomplicated: Secondary | ICD-10-CM | POA: Diagnosis not present

## 2016-12-22 DIAGNOSIS — Z7982 Long term (current) use of aspirin: Secondary | ICD-10-CM | POA: Insufficient documentation

## 2016-12-22 DIAGNOSIS — Y9389 Activity, other specified: Secondary | ICD-10-CM | POA: Insufficient documentation

## 2016-12-22 DIAGNOSIS — Z79899 Other long term (current) drug therapy: Secondary | ICD-10-CM | POA: Diagnosis not present

## 2016-12-22 DIAGNOSIS — I10 Essential (primary) hypertension: Secondary | ICD-10-CM | POA: Diagnosis not present

## 2016-12-22 DIAGNOSIS — S20219A Contusion of unspecified front wall of thorax, initial encounter: Secondary | ICD-10-CM | POA: Diagnosis not present

## 2016-12-22 DIAGNOSIS — S29001A Unspecified injury of muscle and tendon of front wall of thorax, initial encounter: Secondary | ICD-10-CM | POA: Diagnosis present

## 2016-12-22 DIAGNOSIS — W01198A Fall on same level from slipping, tripping and stumbling with subsequent striking against other object, initial encounter: Secondary | ICD-10-CM | POA: Insufficient documentation

## 2016-12-22 DIAGNOSIS — Y92002 Bathroom of unspecified non-institutional (private) residence single-family (private) house as the place of occurrence of the external cause: Secondary | ICD-10-CM | POA: Insufficient documentation

## 2016-12-22 DIAGNOSIS — Y999 Unspecified external cause status: Secondary | ICD-10-CM | POA: Insufficient documentation

## 2016-12-22 DIAGNOSIS — E119 Type 2 diabetes mellitus without complications: Secondary | ICD-10-CM | POA: Insufficient documentation

## 2016-12-22 DIAGNOSIS — J449 Chronic obstructive pulmonary disease, unspecified: Secondary | ICD-10-CM | POA: Insufficient documentation

## 2016-12-22 MED ORDER — HYDROCODONE-ACETAMINOPHEN 5-325 MG PO TABS: 1.0000 | ORAL_TABLET | Freq: Three times a day (TID) | ORAL | 0 refills | Status: AC | PRN

## 2016-12-22 MED ORDER — HYDROCODONE-ACETAMINOPHEN 5-325 MG PO TABS
1.0000 | ORAL_TABLET | Freq: Once | ORAL | Status: AC
  Administered 2016-12-22: 1 via ORAL
  Filled 2016-12-22: qty 1

## 2016-12-22 NOTE — Discharge Instructions (Signed)
Use spirometer 10 times per hour to keep lungs inflated. Follow-up as needed with worsening cough, shortness of breath, fever, other changes

## 2016-12-22 NOTE — ED Provider Notes (Signed)
[facility] Provider Note   CSN: [id number] Arrival date & time: 12/22/16  1037     History   Chief Complaint Chief Complaint  Patient presents with  . Chest Injury    HPI Terri Chen is a 82 y.o. female. Chief complaint is chest pain after fall.  HPI: 82 year old female. Lives with her daughter. She fell in her bathroom 3 days ago. She awakened to 12 with her chest and then landed on the floor. She was able to get up. Her daughter helped her. She has been doing well since then. She's not having more falls. She denies dizziness or other symptoms just that her chest is painful to breathe, cough, or even roll over in bed.  Past Medical History:  Diagnosis Date  . 1979    left side weakness  . Anxiety   . COPD (chronic obstructive pulmonary disease) (HCC)   . Diabetes mellitus   . Diabetes mellitus without complication (HCC)   . High cholesterol   . Hyperlipidemia   . Hypertension   . Hypertension   . Pneumonia     Patient Active Problem List   Diagnosis Date Noted  . DM (diabetes mellitus) (HCC) 06/08/2015  . HTN (hypertension) 06/08/2015  . HLD (hyperlipidemia) 06/08/2015  . Hip fracture (HCC) 06/08/2015  . Closed left hip fracture (HCC)   . Recurrent falls   . Fracture, humerus, proximal 04/07/2015  . Colles' fracture of right radius 09/07/2013    Past Surgical History:  Procedure Laterality Date  . ABDOMINAL HYSTERECTOMY    . COLONOSCOPY    . EYE SURGERY Bilateral    cataract surgery with lens implant  . HIP PINNING,CANNULATED Left 06/08/2015   Procedure: CANNULATED HIP PINNING;  Surgeon: Eldred Manges, MD;  Location: [facility] OR;  Service: Orthopedics;  Laterality: Left;  . KNEE SURGERY Right    arthroscopic  . LUNG SURGERY    . lung surgery    . ORIF HUMERUS FRACTURE Left 04/07/2015  . ORIF HUMERUS FRACTURE Left 04/07/2015   Procedure: OPEN REDUCTION INTERNAL FIXATION (ORIF) PROXIMAL HUMERUS FRACTURE;  Surgeon: Eldred Manges, MD;  Location: [facility] OR;   Service: Orthopedics;  Laterality: Left;  . TONSILLECTOMY      OB History    Gravida Para Term Preterm AB Living   0 0 0 0 0     SAB TAB Ectopic Multiple Live Births   0 0 0           Home Medications    Prior to Admission medications   Medication Sig Start Date End Date Taking? Authorizing Provider  amLODipine (NORVASC) 5 MG tablet Take 5 mg by mouth daily with breakfast.     [provider]  aspirin EC 81 MG tablet Take 81 mg by mouth daily.    [provider]  budesonide-formoterol (SYMBICORT) 160-4.5 MCG/ACT inhaler Inhale 2 puffs into the lungs 2 (two) times daily as needed. For shortness of breath    [provider]  clonazePAM (KLONOPIN) 0.5 MG tablet Take 0.5 mg by mouth at bedtime as needed. For sleep 04/12/16   [provider]  HYDROcodone-acetaminophen (NORCO/VICODIN) 5-325 MG tablet Take 1 tablet by mouth every 8 (eight) hours as needed for moderate pain. 12/22/16   Rolland Porter, MD  ibuprofen (ADVIL,MOTRIN) 200 MG tablet Take 200 mg by mouth daily as needed for moderate pain.    [provider]  metoprolol (LOPRESSOR) 100 MG tablet Take 100 mg by mouth daily with breakfast.  07/23/13   [provider]  pravastatin (PRAVACHOL) 40 MG tablet Take 40 mg by mouth daily after supper.     [provider]    Family History Family History  Problem Relation Age of Onset  . Diabetes Mother   . COPD Mother   . Heart attack Father     Social History Social History  Substance Use Topics  . Smoking status: Current Every Day Smoker    Packs/day: 0.50    Years: 60.00    Types: Cigarettes  . Smokeless tobacco: Never Used  . Alcohol use No     Allergies   Cefprozil; Erythromycin base; Nitrofurantoin; and Tramadol   Review of Systems Review of Systems  Constitutional: Negative for appetite change, chills, diaphoresis, fatigue and fever.  HENT: Negative for mouth sores, sore throat and trouble swallowing.   Eyes:  Negative for visual disturbance.  Respiratory: Negative for cough, chest tightness, shortness of breath and wheezing.   Cardiovascular: Positive for chest pain.  Gastrointestinal: Negative for abdominal distention, abdominal pain, diarrhea, nausea and vomiting.  Endocrine: Negative for polydipsia, polyphagia and polyuria.  Genitourinary: Negative for dysuria, frequency and hematuria.  Musculoskeletal: Negative for gait problem.  Skin: Negative for color change, pallor and rash.  Neurological: Negative for dizziness, syncope, light-headedness and headaches.  Hematological: Does not bruise/bleed easily.  Psychiatric/Behavioral: Negative for behavioral problems and confusion.     Physical Exam Updated Vital Signs BP (!) 187/78 (BP Location: Left Arm)   Pulse 72   Temp (!) 97.4 F (36.3 C) (Oral)   Resp 16   SpO2 98%   Physical Exam  Constitutional: She is oriented to person, place, and time. She appears well-developed and well-nourished. No distress.  HENT:  Head: Normocephalic.  Eyes: Pupils are equal, round, and reactive to light. Conjunctivae are normal. No scleral icterus.  Neck: Normal range of motion. Neck supple. No thyromegaly present.  Cardiovascular: Normal rate and regular rhythm.  Exam reveals no gallop and no friction rub.   No murmur heard. Pulmonary/Chest: Effort normal and breath sounds normal. No respiratory distress. She has no wheezes. She has no rales.  Tender over the lower sternum. No upper abdominal tenderness. Clear lungs. Saturating 98%. No obvious bruising, soft tissue swelling, or crepitus on exam. No lateral rib tenderness. Clear lungs posteriorly.  Abdominal: Soft. Bowel sounds are normal. She exhibits no distension. There is no tenderness. There is no rebound.  Musculoskeletal: Normal range of motion.  Neurological: She is alert and oriented to person, place, and time.  Skin: Skin is warm and dry. No rash noted.  Psychiatric: She has a normal mood and  affect. Her behavior is normal.     ED Treatments / Results  Labs (all labs ordered are listed, but only abnormal results are displayed) Labs Reviewed - No data to display  EKG  EKG Interpretation  Date/Time:  Saturday December 22 2016 10:57:03 EDT Ventricular Rate:  68 PR Interval:  126 QRS Duration: 66 QT Interval:  384 QTC Calculation: 408 R Axis:   66 Text Interpretation:  Normal sinus rhythm ST & T wave abnormality, consider inferior ischemia ST & T wave abnormality, consider anterolateral ischemia Abnormal ECG No STEMI.  Confirmed by Alona Bene [phone]) on 12/22/2016 11:01:17 AM       Radiology Dg Chest 2 View  Result Date: 12/22/2016 CLINICAL DATA:  Larey Seat 4 days ago with anterior chest pain and sternal pain. EXAM: CHEST  2 VIEW COMPARISON:  06/07/2015 radiography.  Abdominal CT  04/30/2016. FINDINGS: Heart size is normal. Aortic atherosclerosis again seen. Pulmonary arterial prominence is noted. The right lung is clear. There is been previous pulmonary resection in the left upper lobe. There is chronic pulmonary herniation through the chest wall laterally on the left. There is some scarring in the left lower lobe that appears more prominent than was seen in 2017. Old healed rib fractures in that region. No sign of fusion. No spinal fracture. No sternal fracture visible. IMPRESSION: Old healed rib fractures on the left. No acute fracture seen. Chronic lung herniation into the left chest wall. Previous pulmonary resection left upper lobe. Chronic appearing scarring at the left base, more prominent than 2017. Cannot completely rule out some active infiltrate in that region. Aortic atherosclerosis. Electronically Signed   By: Paulina Fusi M.D.   On: 12/22/2016 11:44    Procedures Procedures (including critical care time)  Medications Ordered in ED Medications  HYDROcodone-acetaminophen (NORCO/VICODIN) 5-325 MG per tablet 1 tablet (not administered)     Initial Impression /  Assessment and Plan / ED Course  I have reviewed the triage vital signs and the nursing notes.  Pertinent labs & imaging results that were available during my care of the patient were reviewed by me and considered in my medical decision making (see chart for details).    Chest x-ray PA and lateral does not show obvious sternal deformity rib fracture or pulmonary abnormality. Suggestion of possible atelectasis versus early infiltrate. She is asymptomatic and lungs are clear. My suspicion is that this is likely atelectasis. She was instructed use of the spirometer. Given a single Vicodin here. She has allergy to Ultram. Has taken Vicodin before without sedation or ill effects. Recheck with fever, shortness of breath, other worsening symptoms. Primary care follow-up as needed.  Final Clinical Impressions(s) / ED Diagnoses   Final diagnoses:  Contusion of chest wall, unspecified laterality, initial encounter    New Prescriptions New Prescriptions   HYDROCODONE-ACETAMINOPHEN (NORCO/VICODIN) 5-325 MG TABLET    Take 1 tablet by mouth every 8 (eight) hours as needed for moderate pain.     Rolland Porter, MD 12/22/16 1247

## 2016-12-22 NOTE — ED Triage Notes (Signed)
Pt states she feel Tuesday in the bathroom.  Not sure how she fell.  C/o chest pain since fall .  States she thinks she hit her chest on the toilet when she fell.

## 2017-01-17 MED ORDER — SODIUM CHLORIDE 0.9% FLUSH
INTRAVENOUS | Status: AC
  Filled 2017-01-17: qty 150

## 2017-02-15 IMAGING — CT CT HEAD W/O CM
1 series · 15 of 30 positions shown, 19 images · non-contrast
Comparison: None.

CLINICAL DATA: Multiple recent falls, with generalized weakness.
Initial encounter.

EXAM:
CT HEAD WITHOUT CONTRAST
TECHNIQUE: Contiguous axial images were obtained from the base of the skull
through the vertex without intravenous contrast.

[Series 2: headtrauma 4.8 h37s · axial · 0.50mm/px · z∈[+70,+230]mm · 15 of 36 slices shown, 19 images]
[im 2/36  brain]
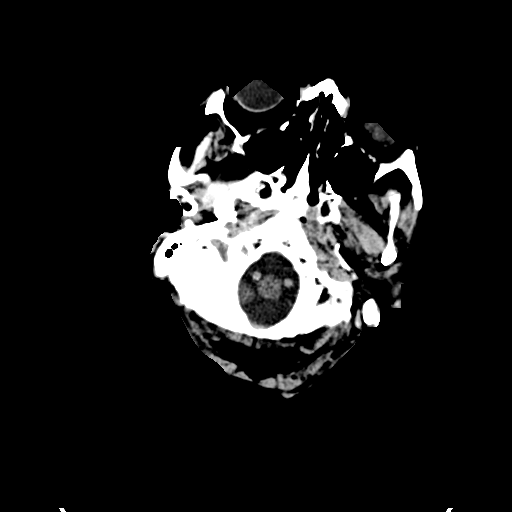
[im 2/36  bone]
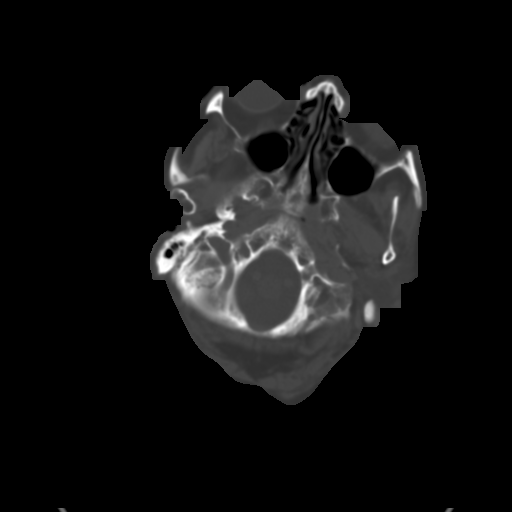
[im 4/36  brain]
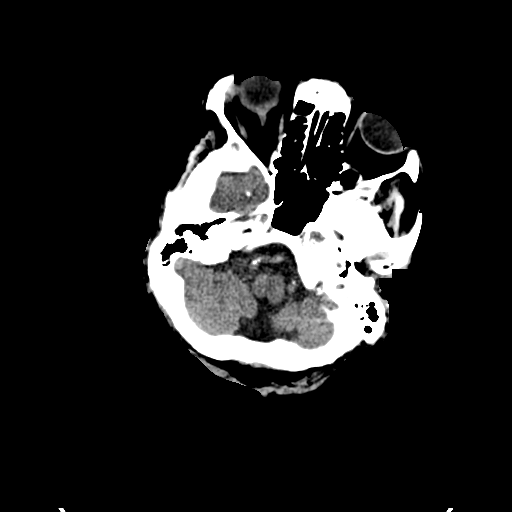
[im 7/36  brain]
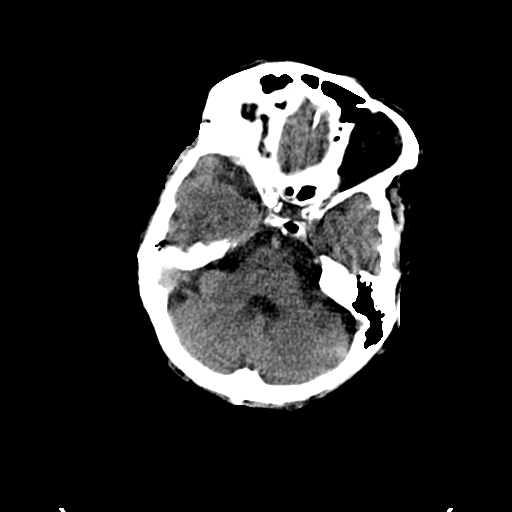
[im 9/36  brain]
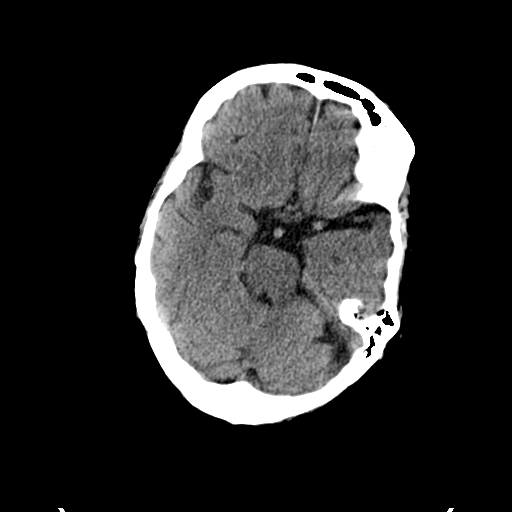
[im 11/36  brain]
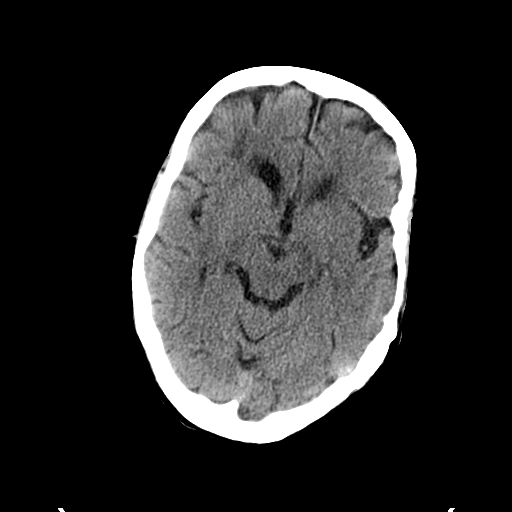
[im 11/36  bone]
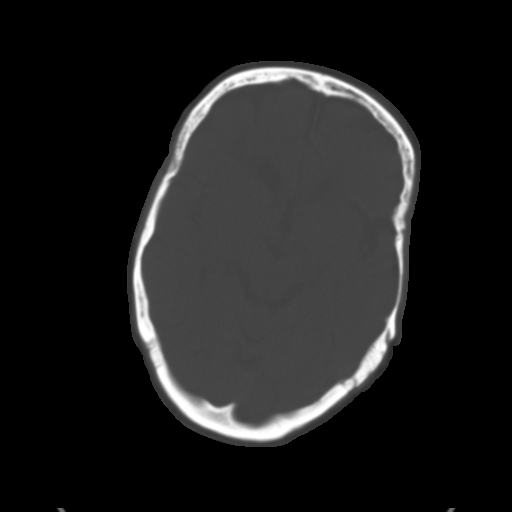
[im 14/36  brain]
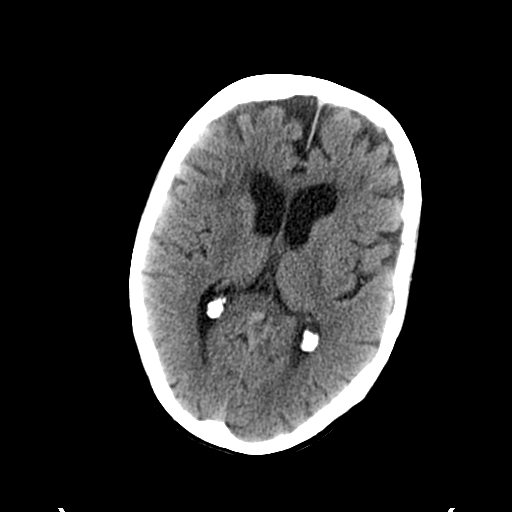
[im 16/36  brain]
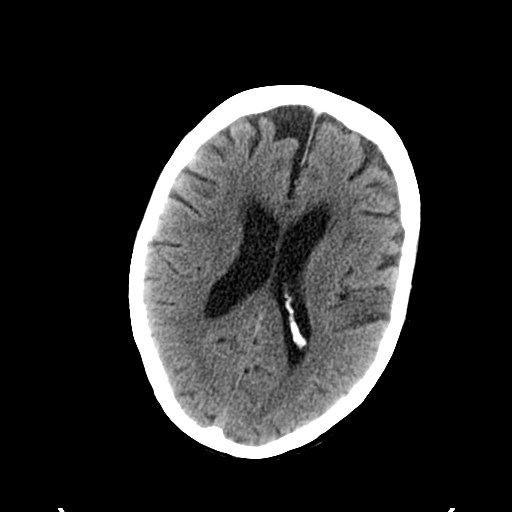
[im 19/36  brain]
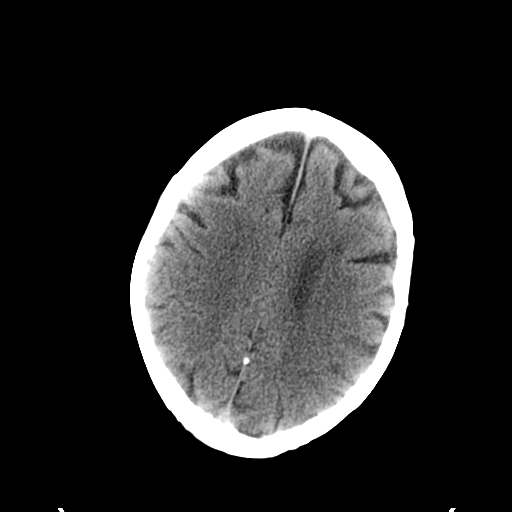
[im 20/36  brain]
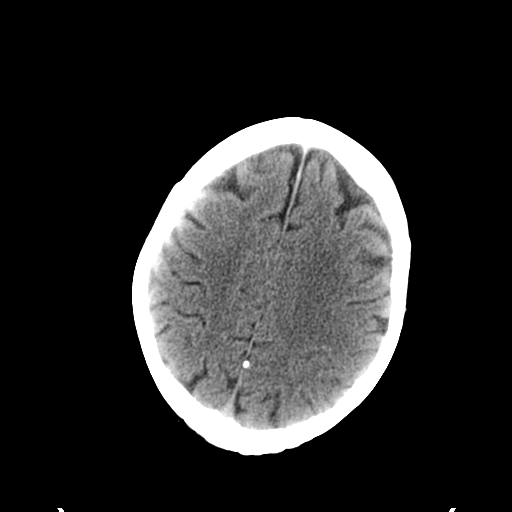
[im 20/36  bone]
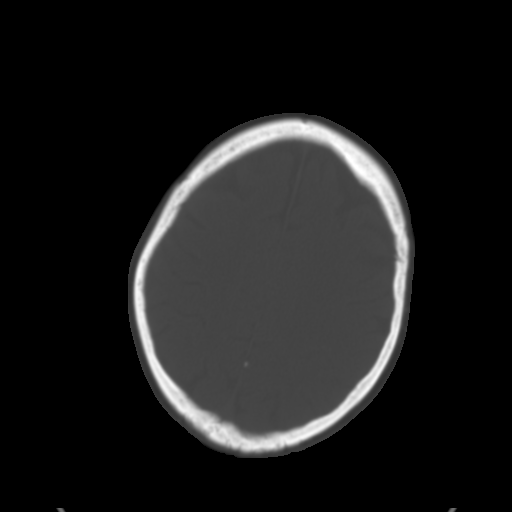
[im 22/36  brain]
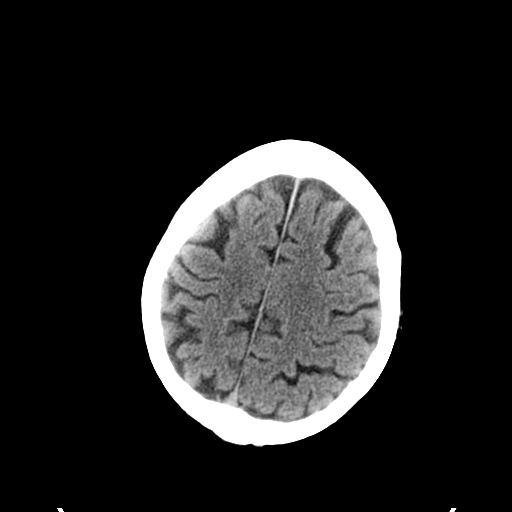
[im 25/36  brain]
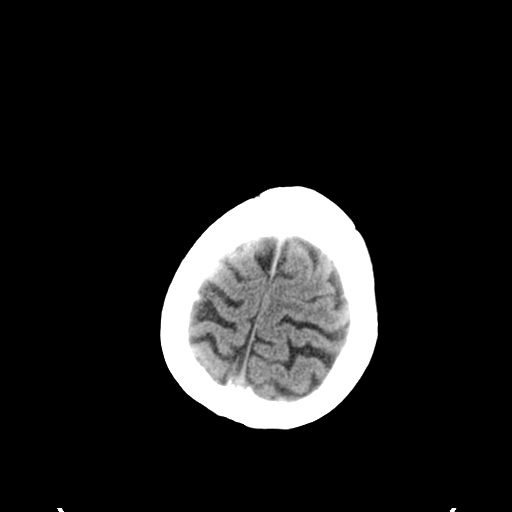
[im 27/36  brain]
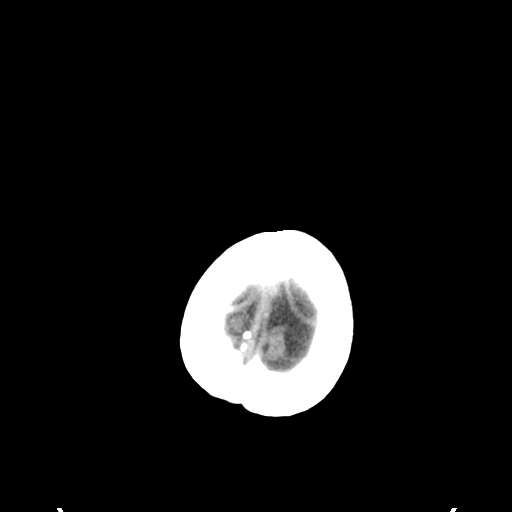
[im 29/36  brain]
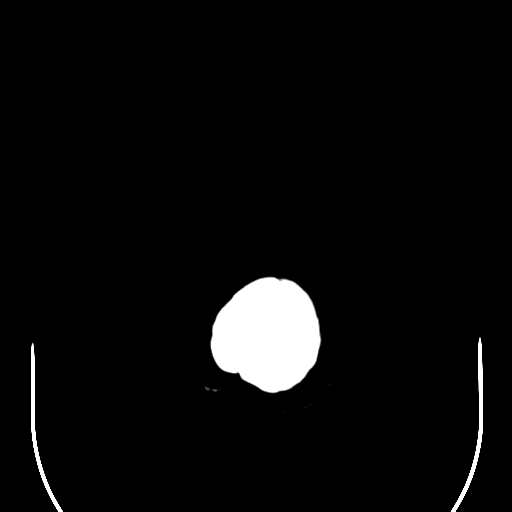
[im 29/36  bone]
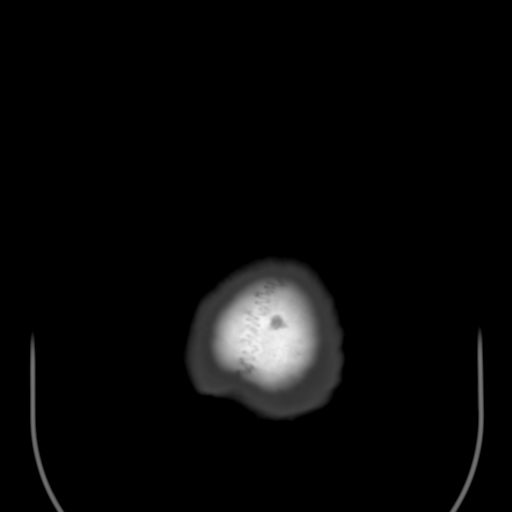
[im 32/36  brain]
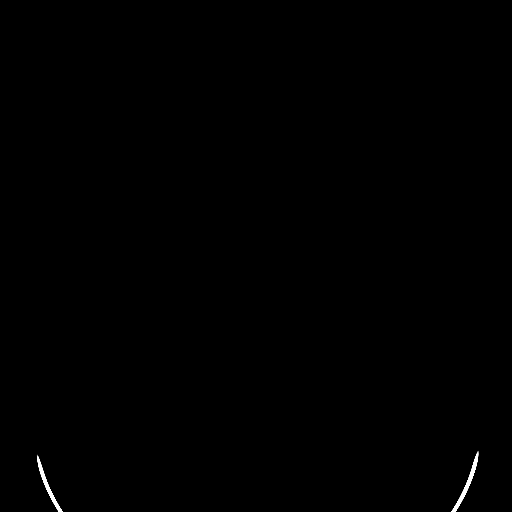
[im 34/36  brain]
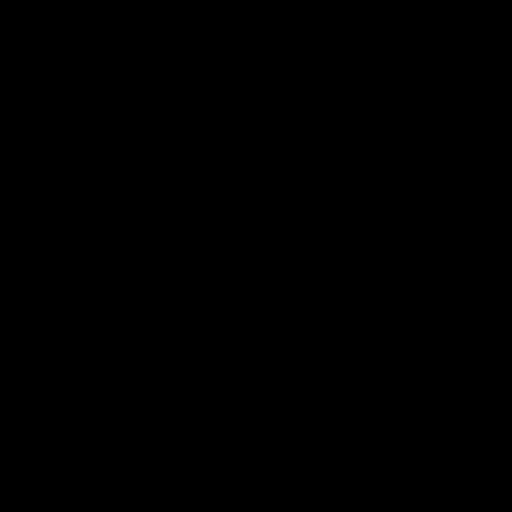

[15 of 30 positions shown; findings below may reference images not displayed]

FINDINGS: There is no evidence of acute infarction, mass lesion, or intra- or
extra-axial hemorrhage on CT.

Prominence of ventricles and sulci reflects mild cortical volume
loss. Scattered periventricular and subcortical white matter change
likely reflects small vessel ischemic microangiopathy. Cerebellar
atrophy is noted.

The brainstem and fourth ventricle are within normal limits. The
basal ganglia are unremarkable in appearance. The cerebral
hemispheres demonstrate grossly normal gray-white differentiation.
No mass effect or midline shift is seen.

There is no evidence of fracture; visualized osseous structures are
unremarkable in appearance. The visualized portions of the orbits
are within normal limits. The paranasal sinuses and mastoid air
cells are well-aerated. No significant soft tissue abnormalities are
seen.
IMPRESSION: 1. No acute intracranial pathology seen on CT.
2. Mild cortical volume loss and scattered small vessel ischemic
microangiopathy.

## 2017-02-15 IMAGING — DX DG ELBOW COMPLETE 3+V*L*
5 series · 5 of 5 positions shown · non-contrast
Comparison: None.

CLINICAL DATA: Status post fall 4 times, with left elbow pain.
Initial encounter.

EXAM:
LEFT ELBOW - COMPLETE 3+ VIEW

[elbow ap]
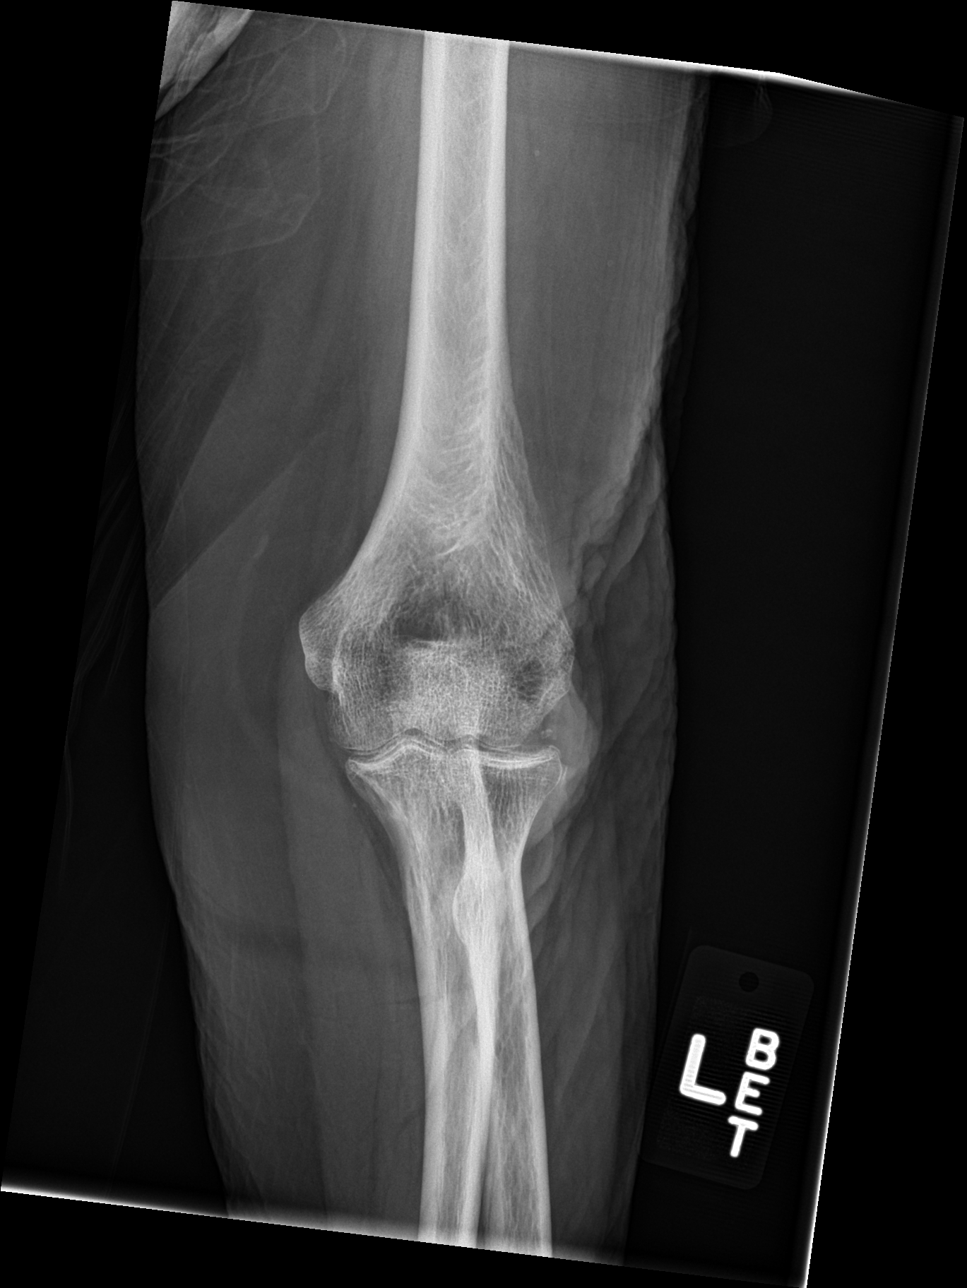

[elbow obl (1 of 2)]
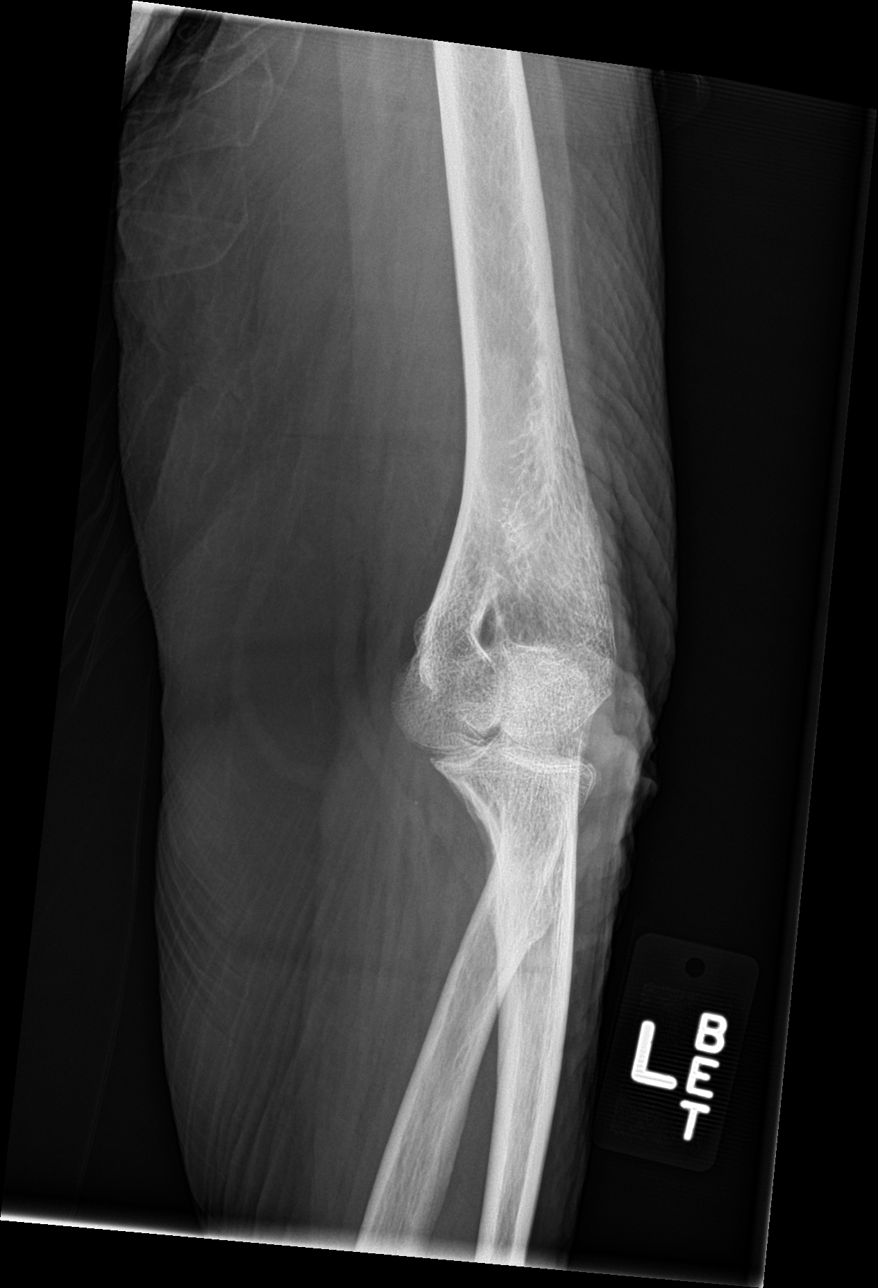

[elbow obl (2 of 2)]
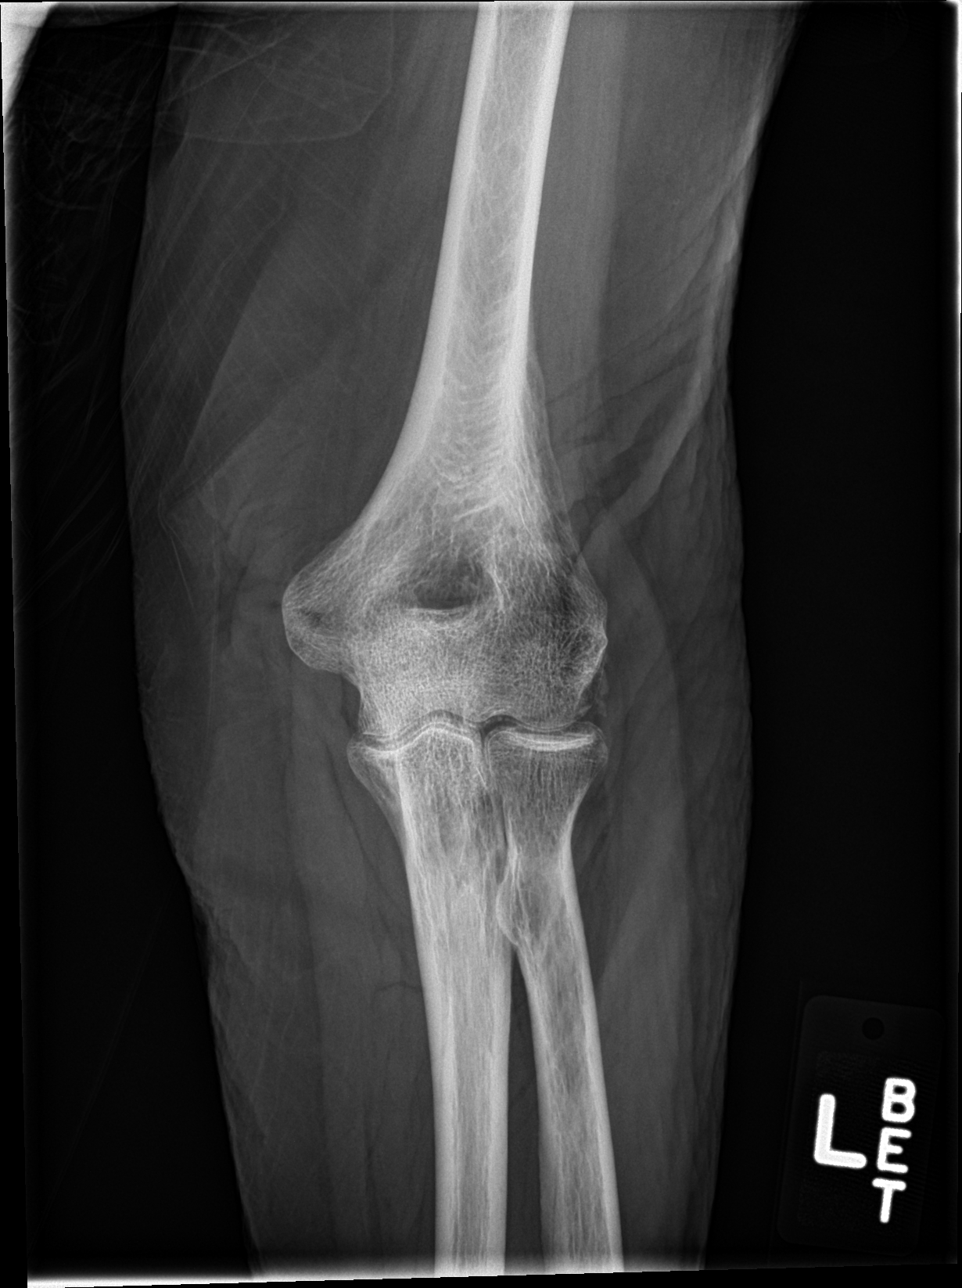

[elbow lat (1 of 2)]
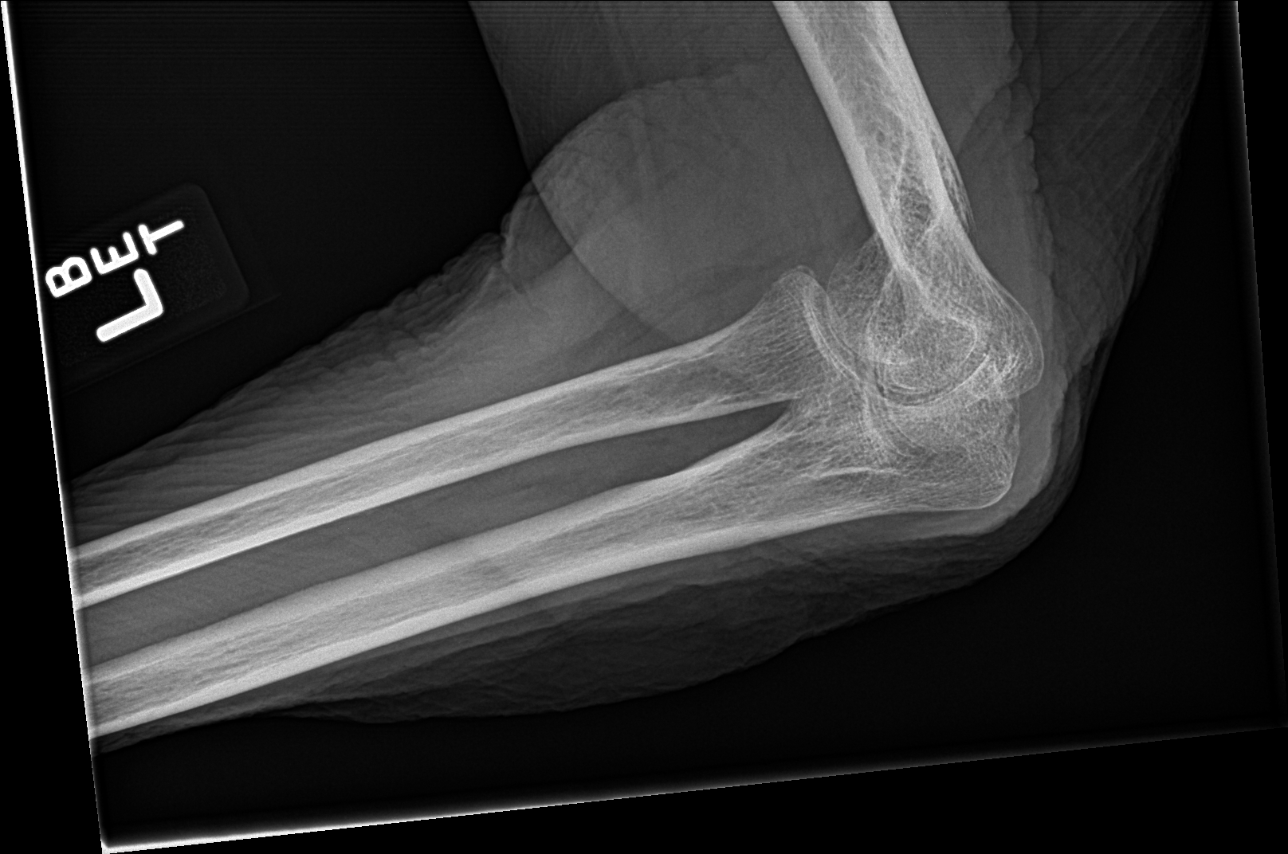

[elbow lat (2 of 2)]
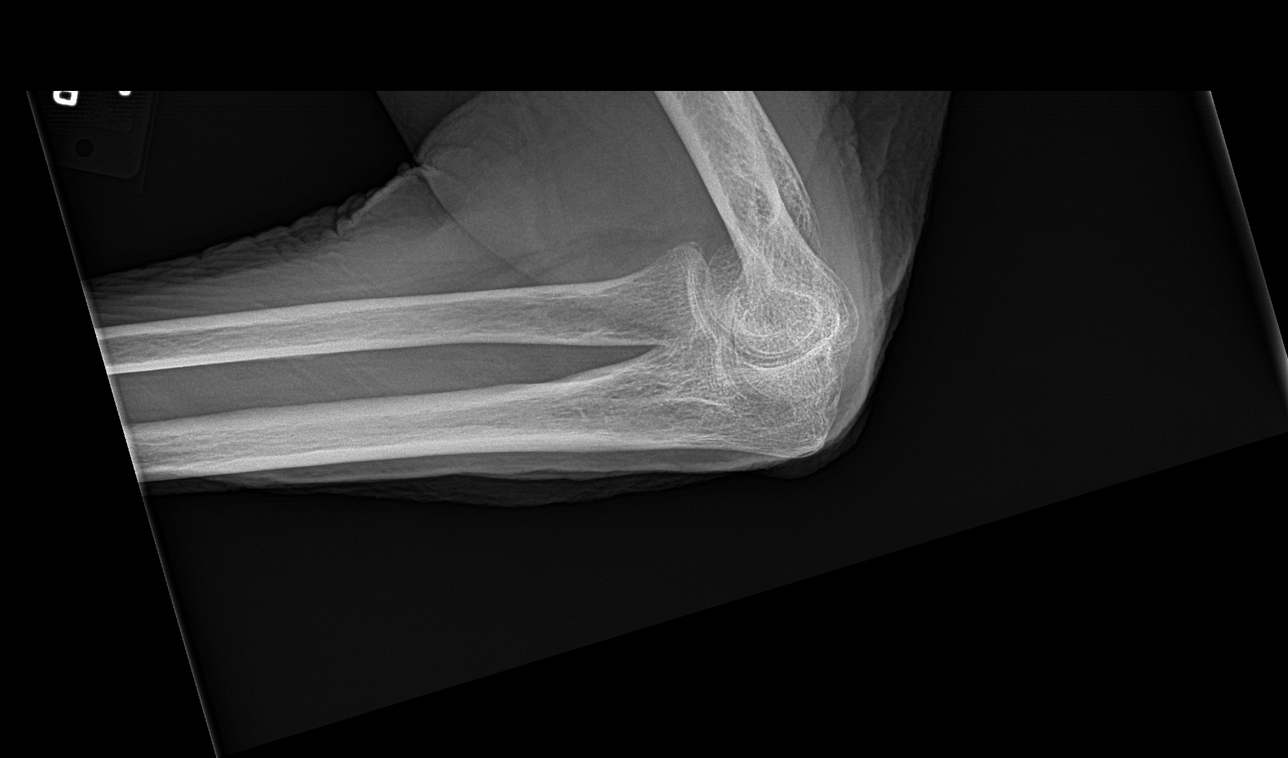

[5 of 5 positions shown; findings below may reference images not displayed]

FINDINGS: There is no evidence of fracture or dislocation. There appears to be
some degree of chronic deformity of the distal humerus. The
visualized joint spaces are preserved. No significant joint effusion
is identified. The soft tissues are unremarkable in appearance.
IMPRESSION: No evidence of acute fracture or dislocation. Underlying apparent
chronic deformity of the distal humerus.

## 2019-12-09 ENCOUNTER — Encounter (INDEPENDENT_AMBULATORY_CARE_PROVIDER_SITE_OTHER): Payer: Self-pay | Admitting: Gastroenterology

## 2019-12-31 ENCOUNTER — Ambulatory Visit (INDEPENDENT_AMBULATORY_CARE_PROVIDER_SITE_OTHER): Payer: Medicare Other | Admitting: Gastroenterology

## 2020-01-08 DEATH — deceased
